# Patient Record
Sex: Female | Born: 1950 | Race: White | Hispanic: No | Marital: Married | Smoking: Never smoker
Health system: Southern US, Community
[De-identification: ages and names within clinical notes are randomized; demographics above are authoritative.]

## PROBLEM LIST (undated history)

## (undated) DIAGNOSIS — R519 Headache, unspecified: Secondary | ICD-10-CM

## (undated) DIAGNOSIS — Z8489 Family history of other specified conditions: Secondary | ICD-10-CM

## (undated) DIAGNOSIS — R011 Cardiac murmur, unspecified: Secondary | ICD-10-CM

## (undated) DIAGNOSIS — E039 Hypothyroidism, unspecified: Secondary | ICD-10-CM

## (undated) DIAGNOSIS — R51 Headache: Secondary | ICD-10-CM

## (undated) DIAGNOSIS — M1990 Unspecified osteoarthritis, unspecified site: Secondary | ICD-10-CM

## (undated) HISTORY — PX: TONSILLECTOMY: SUR1361

## (undated) HISTORY — DX: Hypothyroidism, unspecified: E03.9

## (undated) HISTORY — DX: Unspecified osteoarthritis, unspecified site: M19.90

---

## 1971-12-25 HISTORY — PX: WISDOM TOOTH EXTRACTION: SHX21

## 2003-10-03 ENCOUNTER — Encounter: Payer: Self-pay | Admitting: Emergency Medicine

## 2003-10-04 ENCOUNTER — Encounter (HOSPITAL_BASED_OUTPATIENT_CLINIC_OR_DEPARTMENT_OTHER): Payer: Self-pay | Admitting: Internal Medicine

## 2003-10-04 ENCOUNTER — Inpatient Hospital Stay (HOSPITAL_COMMUNITY): Admission: EM | Admit: 2003-10-04 | Discharge: 2003-10-05 | Payer: Self-pay | Admitting: Emergency Medicine

## 2007-06-24 ENCOUNTER — Ambulatory Visit: Payer: Self-pay | Admitting: Internal Medicine

## 2007-07-10 ENCOUNTER — Ambulatory Visit: Payer: Self-pay | Admitting: Internal Medicine

## 2010-08-15 IMAGING — CT CT NECK W/O CM
2 series · 10 of 14 positions shown, 12 images · IV contrast (CONTRAST)
Comparison: NONE

CLINICAL DATA: Abnormal thyroid ultrasound with appearance 
suggesting  adenopathy. 

CT NECK WITHOUT AND WITH INTRAVENOUS CONTRAST
TECHNIQUE: A series of axial 5-mm-thick slices at 5-mm 
increments were obtained prior to intravenous contrast.  Following 
the intravenous administration of contrast, 3-mm-thick slices at 
3-mm increments were obtained.

[Series 2: without · axial · non-contrast · 0.49mm/px · z∈[+21,+131]mm · 3 of 45 slices shown]
[im 12/45  bone]
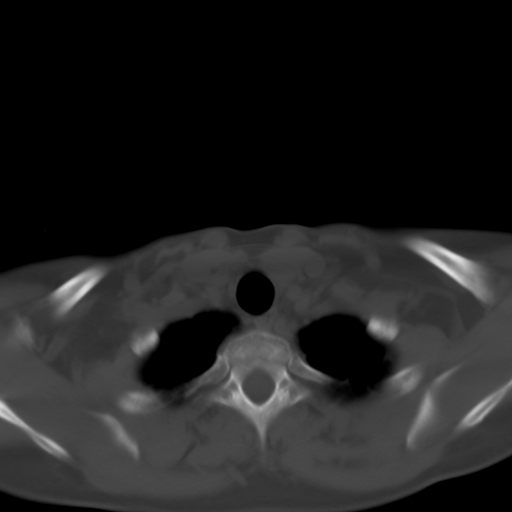
[im 23/45  bone]
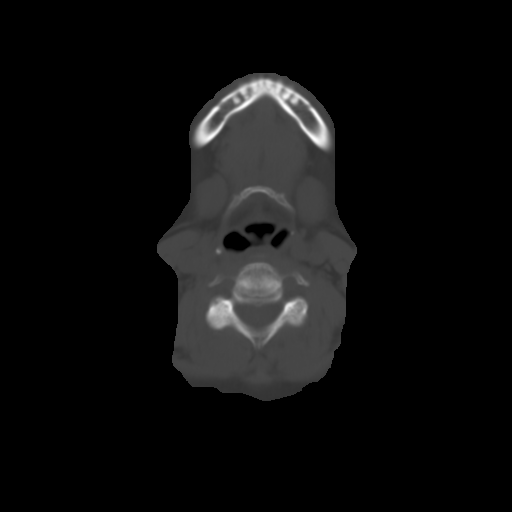
[im 34/45  bone]
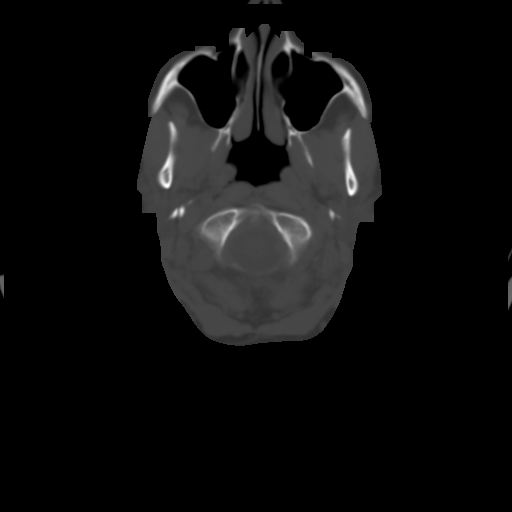

[Series 3: with contrast · axial · 0.49mm/px · z∈[-8,+157]mm · 7 of 75 slices shown, 9 images]
[im 10/75  soft-tissue]
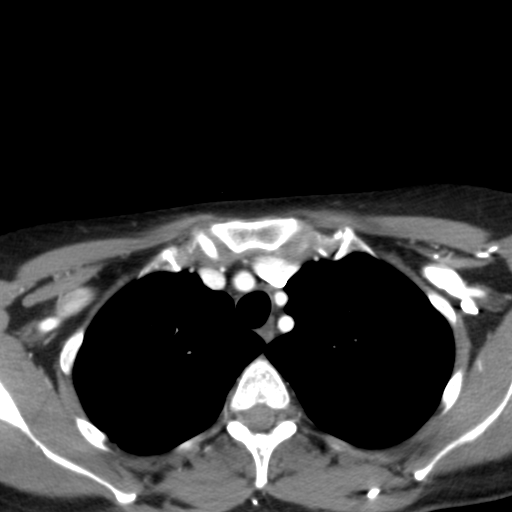
[im 10/75  bone]
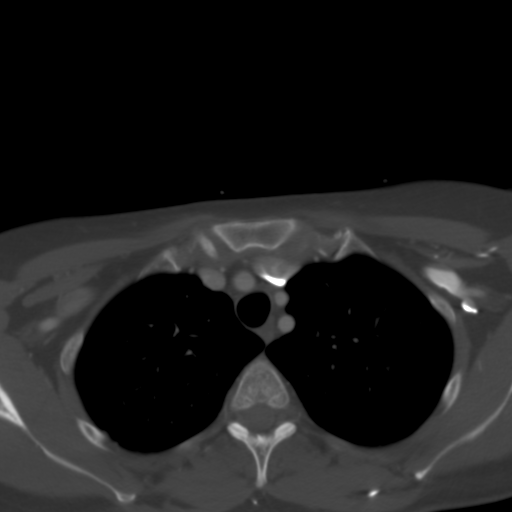
[im 19/75  bone]
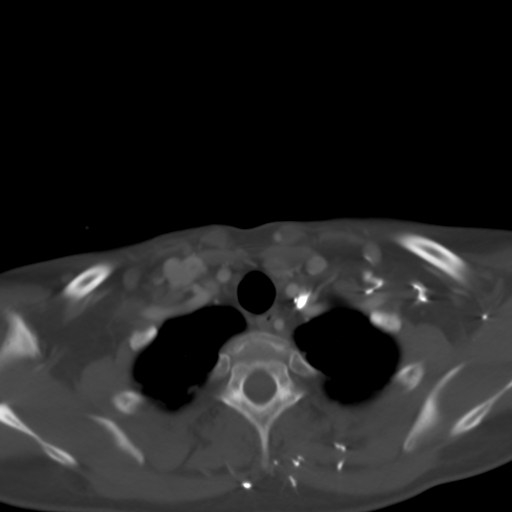
[im 28/75  bone]
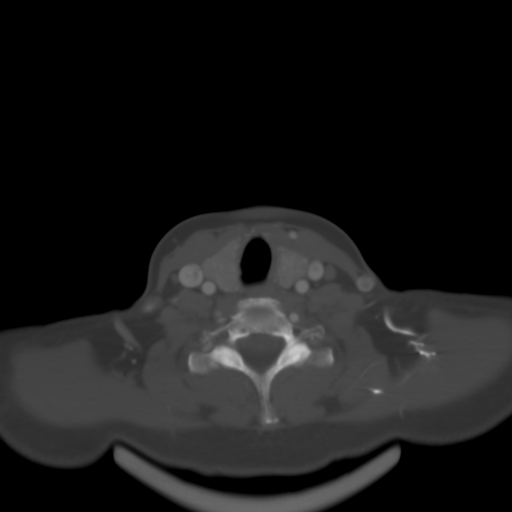
[im 38/75  bone]
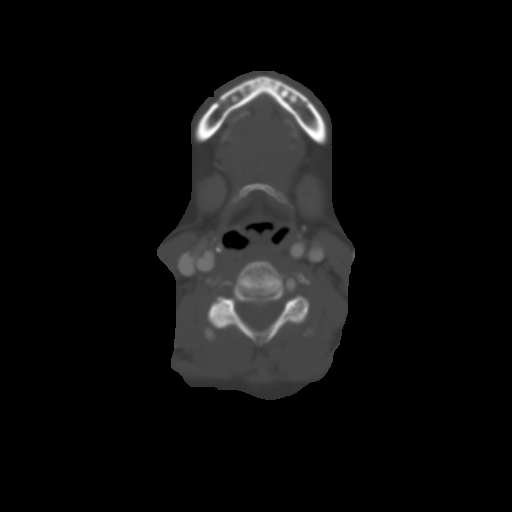
[im 47/75  soft-tissue]
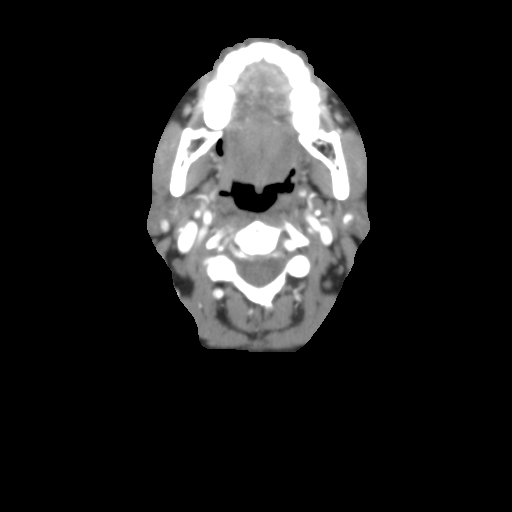
[im 47/75  bone]
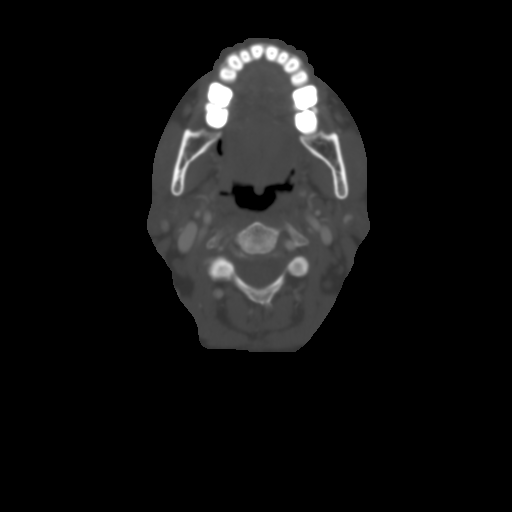
[im 56/75  bone]
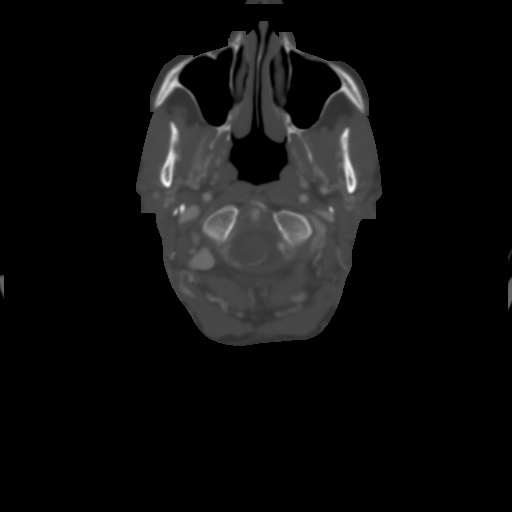
[im 65/75  bone]
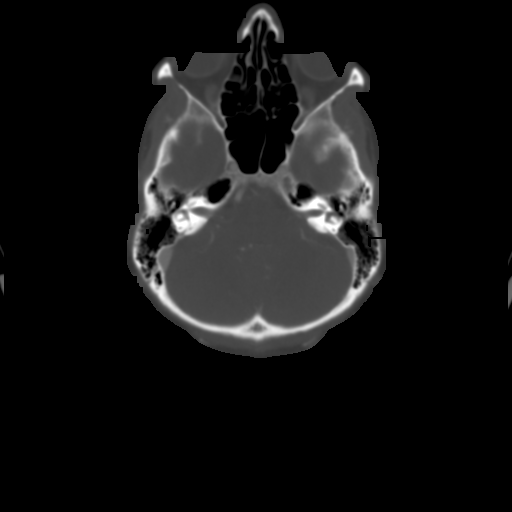

[10 of 14 positions shown; findings below may reference images not displayed]

FINDINGS: No evidence of nasopharyngeal, oropharyngeal, or 
hypopharyngeal mass.  No supraglottic, glottic, or infraglottic 
masses are identified.  Minimal reactive adenopathy in the neck 
without evidence of coalescent mass or adenopathy.  No evidence of 
parotid or submandibular gland mass or calculus.  No 
supraclavicular fossa mass or adenopathy.  Thyroid gland appears 
heterogeneous without focal mass on CT. Vascular structures appear 
unremarkable. No anterior superior mediastinal or apical lung 
masses.  No lytic or blastic lesions are identified.
IMPRESSION: Minimal reactive adenopathy.  No evidence of mass or 
adenopathy to suggest neoplasm. Mus, Tc Fethi 
electronically reviewed on 09/03/2008 Dict Date: 09/03/2008  Tran 
Date:  09/03/2008 DAS  JLM

## 2011-01-14 ENCOUNTER — Encounter: Payer: Self-pay | Admitting: Internal Medicine

## 2011-01-14 ENCOUNTER — Encounter: Payer: Self-pay | Admitting: Family Medicine

## 2011-05-11 NOTE — Discharge Summary (Signed)
   NAME:  Tonya Dawson, Tonya Dawson                            ACCOUNT NO.:  [id number]   MEDICAL RECORD NO.:  [id number]                   PATIENT TYPE:  INP   LOCATION:  0347                                 FACILITY:  [facility]   PHYSICIAN:  Sean A. Everardo All, M.D. [facility]           DATE OF BIRTH:  Feb 16, 1951   DATE OF ADMISSION:  10/03/2003  DATE OF DISCHARGE:  10/05/2003                                 DISCHARGE SUMMARY   REASON FOR ADMISSION:  Head injury with amnestic episode.   HISTORY OF PRESENT ILLNESS:  The patient is a 60 year old woman admitted by  Casimiro Needle E. Norins, M.D., on October 03, 2003, following a fall from a horse  on October 03, 2003, following which she reports amnesia for part of a day  after the episode and more than one day prior.  Please refer to Dr. Debby Bud'  admission history and physical for details.   HOSPITAL COURSE:  The patient was admitted and head CT did not show any  acute injury.  The patient's symptoms did not change during hospitalization,  and neurology was consulted.  Michael L. Thad Ranger, M.D., recommended in  patient EEG with outpatient follow-up.   DISCHARGE DIAGNOSIS:  Fall from a horse, with amnestic episode.   MEDICATIONS:  Tylenol if needed for pain.   No restriction on diet or activity.   FOLLOW-UP:  1. Dr. Thad Ranger in one week to review EEG results.  2. To see Dr. Debby Bud or Dr. Everardo All at your convenience to get established     with a primary care physician.                                               Sean A. Everardo All, M.D. [facility]    SAE/MEDQ  D:  10/05/2003  T:  10/05/2003  Job:  [id number]   cc:   Rosalyn Gess. Norins, M.D. [facility]

## 2011-05-11 NOTE — H&P (Signed)
NAME:  Tonya Dawson, Tonya Dawson                            ACCOUNT NO.:  [id number]   MEDICAL RECORD NO.:  [id number]                   PATIENT TYPE:  EMS   LOCATION:  ED                                   FACILITY:  [facility]   PHYSICIAN:  Rosalyn Gess. Norins, M.D. [facility]         DATE OF BIRTH:  May 10, 1951   DATE OF ADMISSION:  10/03/2003  DATE OF DISCHARGE:                                HISTORY & PHYSICAL   CHIEF COMPLAINT:  Mental status changes.   HISTORY OF PRESENT ILLNESS:  [name] is a healthy 60 year old married white  female, mother of 2 daughters, who went horse back riding today near  [place]. She fell off her horse but does not remember the details.  There is a question of loss of consciousness with the patient herself  reporting that when she awoke. Her loss of consciousness duration was  unknown. The patient reports that when she did awake, she started to feel  groggy. She had an episode of urinary incontinence. She was able to use her  cell phone to notify her husband on 2 occasions. He actually went and met  her on the trail by [place]. She had been in the company of  [employer], who found her and her horse. She was able to walk  back to the stable with her husband. She remained groggy throughout the  afternoon. Because of a history of migraine headache, they thought that she  might have been having migraine-like symptoms including nausea and non-  projectile vomiting. Because of persistent memory loss, she was brought to  [facility] emergency department for evaluation. She is  now admitted for observation.   PAST SURGICAL HISTORY:  Tonsillectomy.   PAST SURGICAL HISTORY:  The patient had a fracture of her right arm at age  60. She had the usual childhood diseases. She has had a normal GYN history  with a gravida 2, para 2. She has had migraines numerous since age 60 but  has not been seen by a neurologist in many years and does not  take any  medication for her headaches. Her frequency is 3 to 5 headaches a month.   MEDICATIONS:  Multivitamins. She was recently diagnosed with some type of  thyroid problem and is on medication, although she does not recall the name.   HABITS:  Tobacco none. Alcohol 4 to 6 ounces per week. She has no  recreational drug use.   ALLERGIES:  No known drug allergies.   FAMILY HISTORY:  Negative for colon cancer. Negative for breast cancer.  Negative for CAD. Negative for diabetes. Positive for migraines in her  father.   SOCIAL HISTORY:  The patient is married 60 years. She has 2 daughters, age  60 and 76. 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 She reports her marriage is in good health.  In addition to  horse back riding, she likes to ski and she otherwise, is active.   REVIEW OF SYSTEMS:  Negative for any constitutional symptoms. Specifically  no weight loss. She has had no cardiovascular, pulmonary, or  gastrointestinal complaints. No prior existing neurologic complaints except  for migraine.   PHYSICAL EXAMINATION:  VITAL SIGNS:  Temperature 99.7, blood pressure  134/84, pulse 82, respiratory rate 20.  GENERAL:  This is a well developed, well nourished, Caucasian female in no  acute distress.  HEENT:  Normocephalic, atraumatic with no signs of hematoma anywhere on her  skull. She had no Battle sign. No raccoon's eyes. No abrasions to her face.  Nose is straight with a midline septum. Oropharynx without lesions.  Conjunctiva and sclera is clear.  NECK:  Supple. There is no thyromegaly.  NODES:  No adenopathy is noted in the cervical or supraclavicular regions.  CHEST:  No CVA tenderness.  LUNGS:  Clear to auscultation and percussion.  CARDIOVASCULAR:  2+ radial and dorsalis pedis pulses. She had a quiet  precordium with a regular rate and rhythm without murmur, rub, or gallop.  BREAST:  Examination deferred to Dr. Elana Alm.  ABDOMEN:   Soft with positive bowel sounds. No guarding or rebound. No  organomegaly or splenomegaly is noted.  PELVIC/RECTAL:  Examinations referred to Dr. Elana Alm.  EXTREMITIES:  Without deformity.  NEUROLOGIC: The patient is awake. Her speech is clear and conversant. She  follows commands. She was able to perform serial 7's. She was able to do  simple computation. She was able to repeat numbers in a forward fashion but  could not repeat numbers in reverse order, nor could she spell the work  world backwards. She does know her birthday. She had trouble remembering her  children's age. She cannot recall the days events. She cannot tell me the  days date. However, she did get the season correctly. The patient does  repeatedly ask about today's events and is not retaining information.  Cranial nerves 2-12 reveals normal facial symmetry and muscle movement.  Extraocular muscles intact. Pupils were equal. They were poorly reactive to  light and do not really seem to have consensual constriction. Funduscopic  examination revealed normal disk margins. There is normal vascular markings  with no abnormality noted. The patient was able to stick out her tongue  without difficulty or deviation. Shoulder shrug was normal. Motor strength,  the patient has good strength throughout and it is equal. DTR's were 2+ and  symmetrical. Toes were downgoing. Sensation was well preserved to light  touch. Cerebellar function, the patient was not stood. She was able to sit  without assistance. She had no tremor. She had no cogwheeling. She had no  pronator drift.   LABORATORY DATA:  CT scan of the brain was read out as negative.   ASSESSMENT:  1. Neurologic. The patient either lost consciousness and fell off her horse     or fell off her horse and lost consciousness. The former is given     credence by the fact that she has no head trauma on examination. She was    able to function in the sense of being able to use her  cell phone and     call home, despite not being able to remember the days events. The     patient has a history of migraine headache but this does not fit her     migraine pattern. Question of whether she had a central nervous  system     event. Of note, the patient had been given 2 mg of morphine before my     examination, which could account for some mild clouding. The patient will     be admitted for observation with q. 4 hour neurologic checks. If her     mental status is not cleared in the a.m., I would recommend MRI of the     brain and possibly neurology consultation. Will avoid all narcotic     sedatives and other mind altering drugs.  2. Endocrine. The patient recently diagnosed with thyroid disorder and     started on medication. Most likely, she is     hypothyroid. Will get a TSH and free T4.  3. Musculoskeletal. The patient has several abrasions and she probably has     some contusions. Will use Toradol for pain relief.                                               Rosalyn Gess Norins, M.D. [facility]    MEN/MEDQ  D:  10/04/2003  T:  10/04/2003  Job:  [id number]   cc:   S. Kyra Manges, M.D.  [phone] [address]  [place]  [place] [address]  Fax: [phone]

## 2011-05-11 NOTE — Consult Note (Signed)
NAME:  Tonya, Dawson                            ACCOUNT NO.:  [id number]   MEDICAL RECORD NO.:  [id number]                   PATIENT TYPE:  INP   LOCATION:  0347                                 FACILITY:  [facility]   PHYSICIAN:  Marolyn Hammock. Thad Ranger, M.D.           DATE OF BIRTH:  Dec 18, 1951   DATE OF CONSULTATION:  10/04/2003  DATE OF DISCHARGE:                                   CONSULTATION   REFERRED BY:  Sean A. Everardo All, M.D.   REASON FOR ADMISSION:  Amnestic episode.   HISTORY OF PRESENT ILLNESS:  This is the initial inpatient consultation  evaluation of this 59 year old woman with a past medical history including  migraine. The patient reports that she was out riding yesterday morning and  apparently fell off of her horse. She really does not remember any details  about this or anything at all about yesterday except for going out in the  morning and preparing for her ride. She apparently was found by some  students who found her. She was subsequently groggy and confused throughout  the remainder of the afternoon and had some nausea and vomiting. She also  had persistent difficulty remembering what had happened and seemed confused  about what was going on around her. Subsequently she was brought to the  emergency room for these symptoms. A CT of the head was performed and was  negative and she was admitted for observation. Over the course of the day,  she has gradually improved. She now says that she feels about normal  although she may be having a little bit of trouble concentrating on things.  She has a little bit of a headache but does not experience any nausea at  this time and she feels almost completely back to baseline. She denies every  having any episodes like this before. There is no previous history of  stroke, seizure, or head injury although she does report occasionally having  migraines so severe that she will be a little bit confused.   PAST MEDICAL HISTORY:   Remarkable for migraine as above. These were fairly a  big problem when she was younger, was not much of a problem for many years  and has gotten a little bit worse recently around the menopause. She denies  any other chronic medical problems.   FAMILY HISTORY/SOCIAL HISTORY/REVIEW OF SYSTEMS:  As outlined in the H&P by  Dr. Debby Bud on October 03, 2003.   MEDICATIONS:  Presently the patient is receiving p.r.n. Tylenol, Toradol,  Phenergan and ibuprofen otherwise no prescription medications.   PHYSICAL EXAMINATION:  VITAL SIGNS:  Temperature 99.8, blood pressure  112/63, pulse 76, respirations 16.  GENERAL:  This is a healthy appearing female in no evident distress.  HEENT:  Head cranium was normocephalic, atraumatic. Oropharynx was benign.  NECK:  Supple without carotid bruits.  HEART:  Regular rate and rhythm without murmurs.  EXTREMITIES:  She does have some evident scrapes on her left elbow and knee.  Range of motion in the extremities is full.  NEUROLOGIC:  Mental status, she is awake, alert and oriented to time, place  and person. Recent memory is intact in that she recalls 3/3 memory objects  after distraction. She has a little trouble naming the past president. She  is able to perform concentration tasks spelling world backwards but has a  little trouble with serial 7's.  Speech is fluent and not dysarthric and she  is able to repeat a phrase. Mood is euthymic and affect appropriate. Cranial  nerves, funduscopic exam is benign. Pupils equal and briskly reactive.  Extraocular movements are normal without nystagmus. Visual fields are full  to confrontation. Hearing is intact to symmetric finger rub. Facial  sensation intact to pinprick. Face, tongue and palate move normally and  symmetrically. Shoulder shrug strength was normal. Lower extremity normal  bulk and tone. Normal strength in all tested extremity muscles. Sensation  intact to light touch, pinprick and vibration in all  extremities.  Coordination, rapid alternating movements are normal. Finger to nose and  heel to shin performed well. Gait, she arises from a chair easily and her  stance is normal and is able to heel toe and tandem walk without difficulty.  Reflexes 2+ and symmetric, toes are down going.   LABORATORY DATA:  CMET remarkable only for an elevated glucose of 138, CBC  remarkable for en elevated white cell count of 15.9 with 91% neutrophils  otherwise unremarkable. Thyroid exam was unremarkable. CT is reportedly  unremarkable. I personally reviewed the MRI of the brain performed earlier  today to my eye it is entirely normal.   IMPRESSION:  Retrograde and antegrade amnesia following trauma related to a  mild closed head injury with a sheer injury, this has improved and she seems  about back to baseline. I cannot exclude a seizure or syncopal event as the  index event but these do not seem likely on the basis of examination as  everything she relates is pretty difficult for a postconcussive injury.   RECOMMENDATIONS:  Will  check EEG in the morning. Once the EEG is complete,  she may be discharged and we can get back with her through any abnormalities  which are not expected. I do not think she needs any further workup or any  further prophylaxis. I doubt that this would be any concern for her in the  future.   Thank you for the consultation.                                               Michael L. Thad Ranger, M.D.    MLR/MEDQ  D:  10/04/2003  T:  10/05/2003  Job:  [id number]   cc:   S. Kyra Manges, M.D.  [phone] [address]  [place]  [place] [address]  Fax: [phone]   Gregary Signs A. Everardo All, M.D. [facility]

## 2011-10-29 ENCOUNTER — Other Ambulatory Visit: Payer: Self-pay | Admitting: Internal Medicine

## 2011-10-29 DIAGNOSIS — E042 Nontoxic multinodular goiter: Secondary | ICD-10-CM

## 2011-11-01 ENCOUNTER — Ambulatory Visit
Admission: RE | Admit: 2011-11-01 | Discharge: 2011-11-01 | Disposition: A | Discharge status: Home / Self Care | Payer: BC Managed Care – PPO | Source: Ambulatory Visit | Attending: Internal Medicine | Admitting: Internal Medicine

## 2011-11-01 DIAGNOSIS — E042 Nontoxic multinodular goiter: Secondary | ICD-10-CM

## 2012-04-08 ENCOUNTER — Encounter: Payer: Self-pay | Admitting: Internal Medicine

## 2012-09-11 IMAGING — US US SOFT TISSUE HEAD/NECK
1 series · 14 of 25 positions shown · non-contrast
Comparison: None.

CLINICAL DATA: Hashimoto's thyroiditis

THYROID ULTRASOUND
TECHNIQUE: Ultrasound examination of the thyroid gland and adjacent
soft tissues was performed.

[Series 1: us soft tissue head/neck · 0.06mm/px · 14 of 44 slices shown]
[im 1/44]
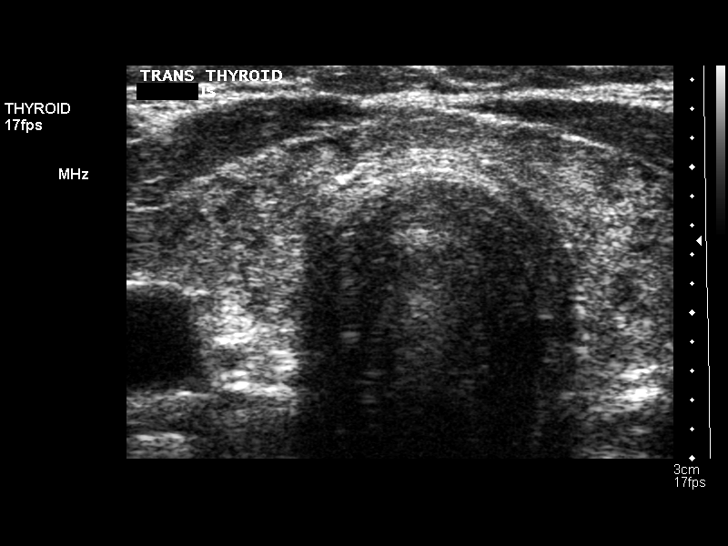
[im 4/44]
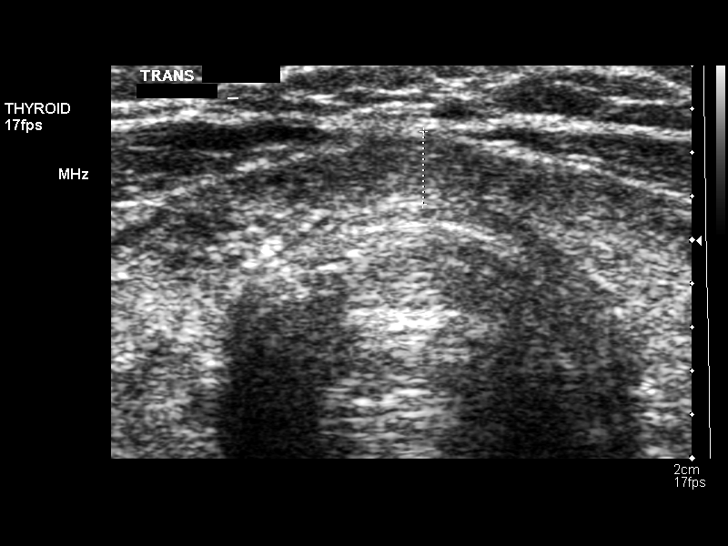
[im 8/44]
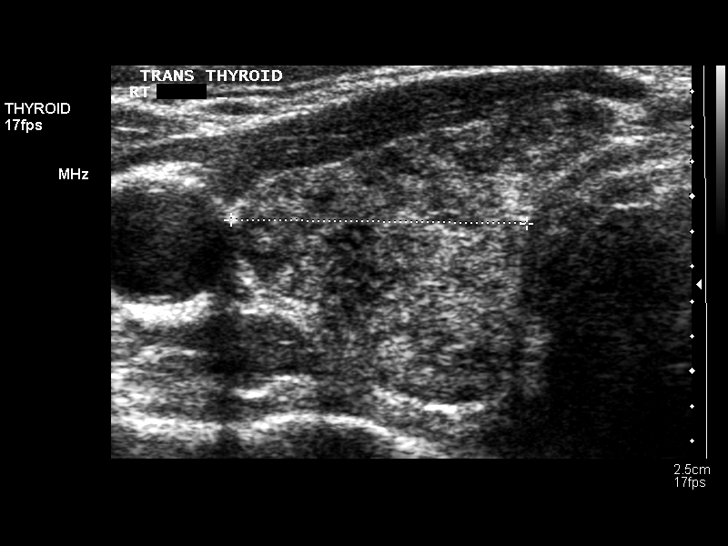
[im 11/44]
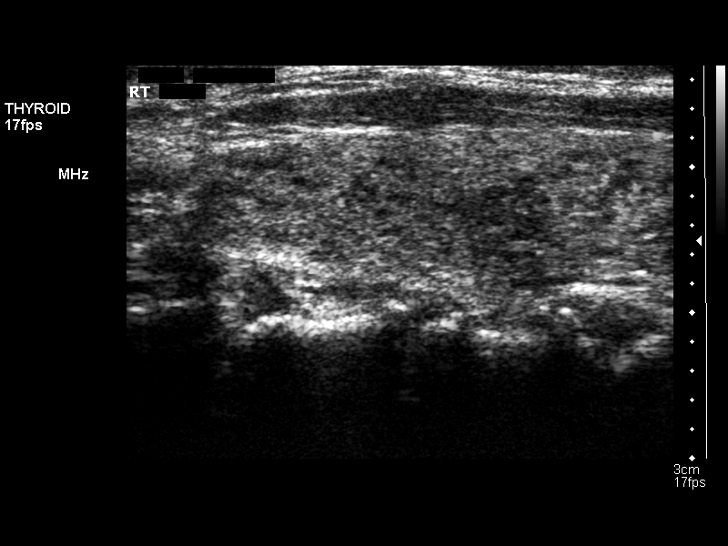
[im 15/44]
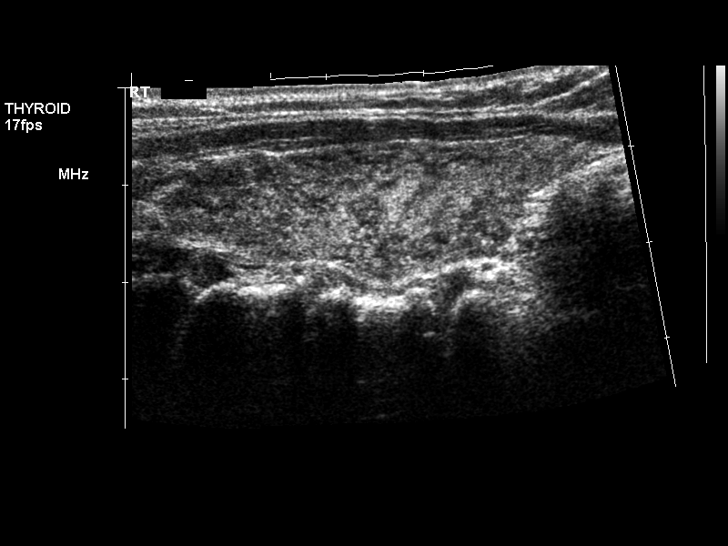
[im 17/44]
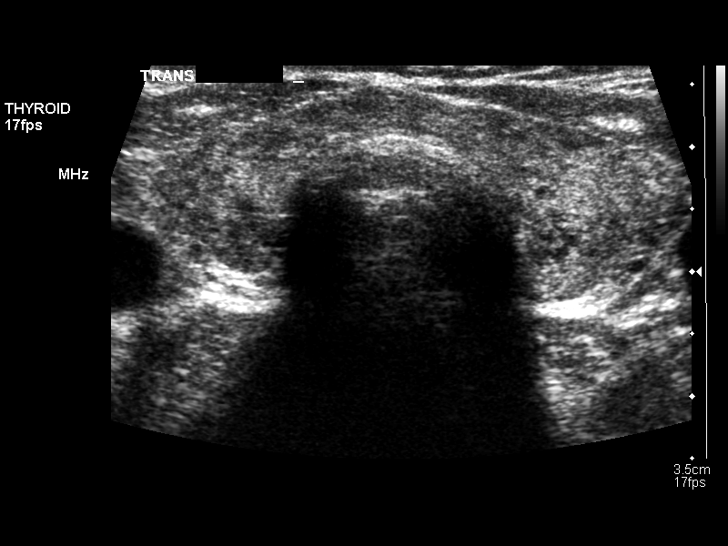
[im 20/44]
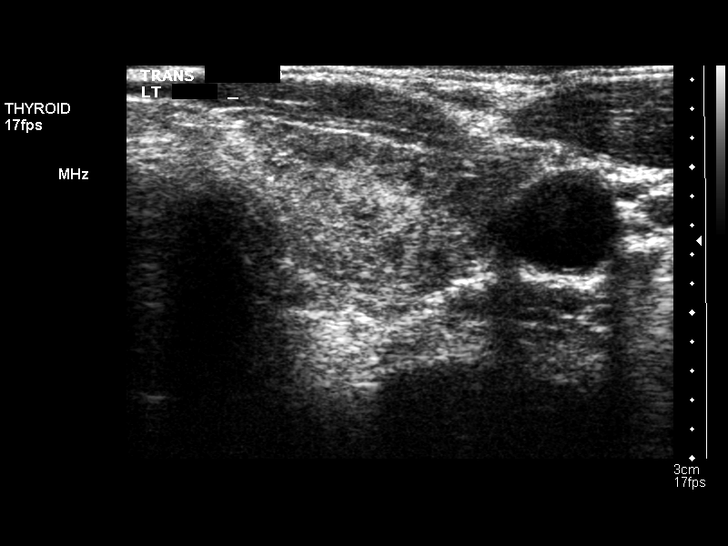
[im 24/44]
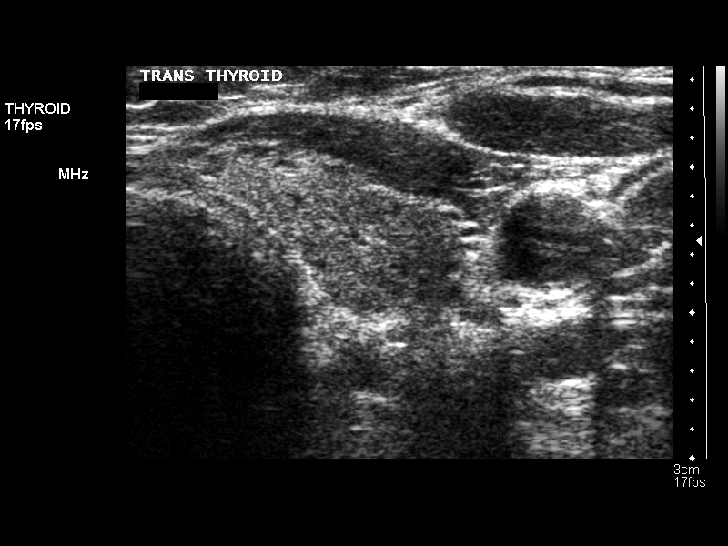
[im 27/44]
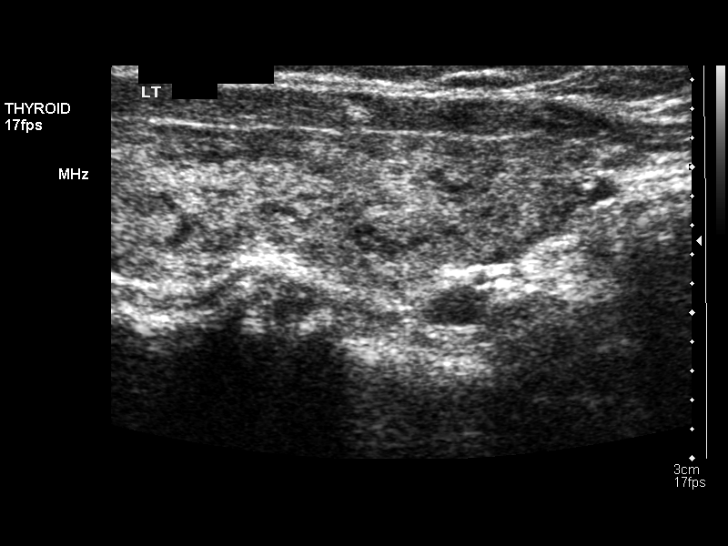
[im 29/44]
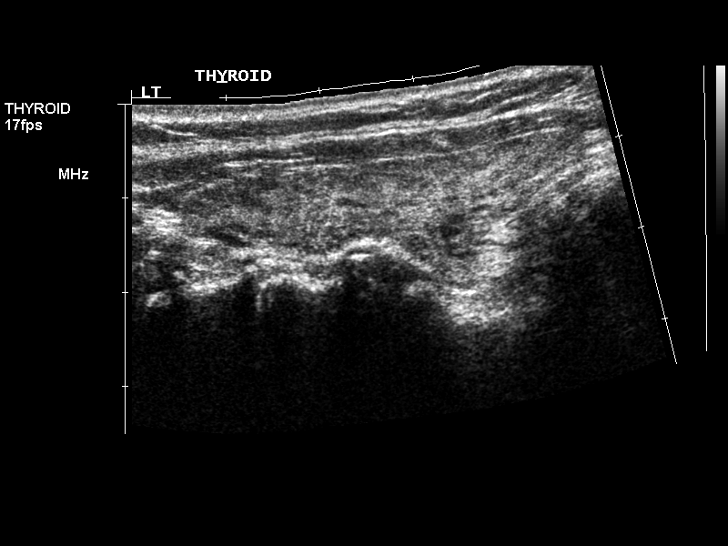
[im 33/44]
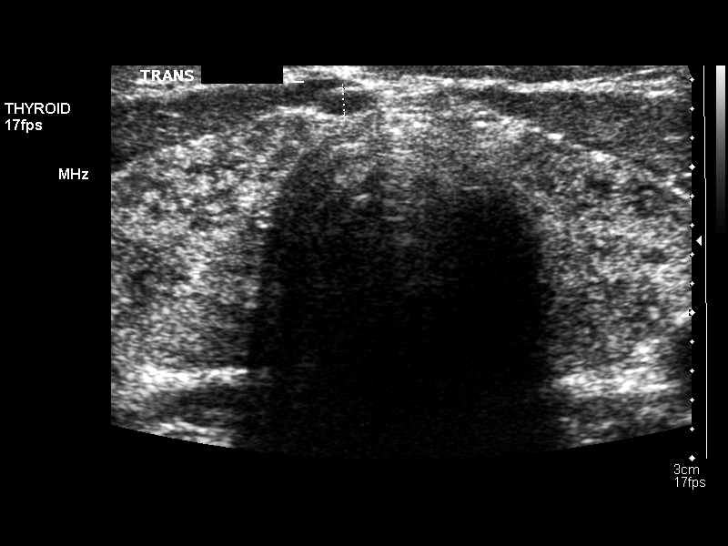
[im 36/44]
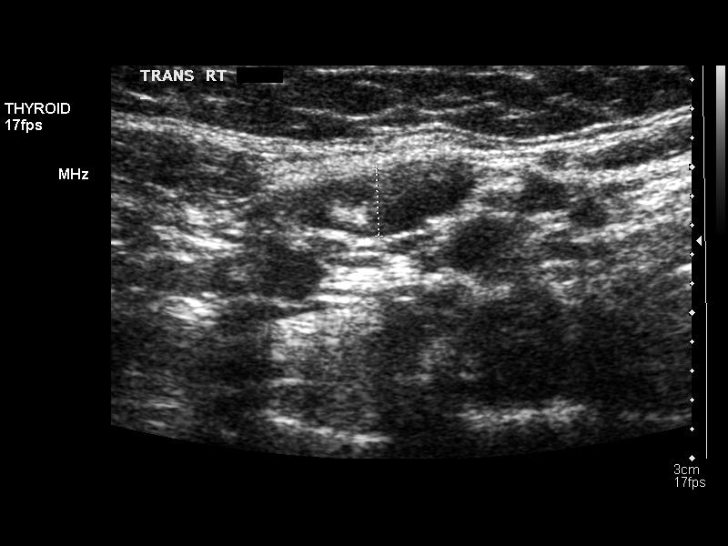
[im 40/44]
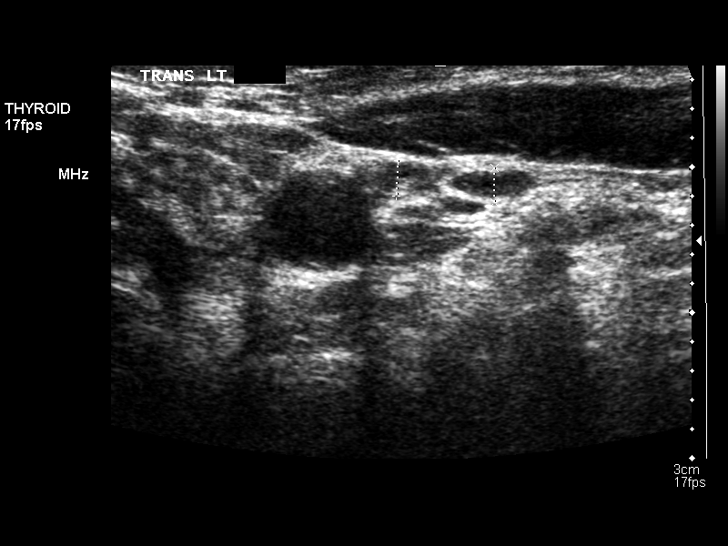
[im 44/44]
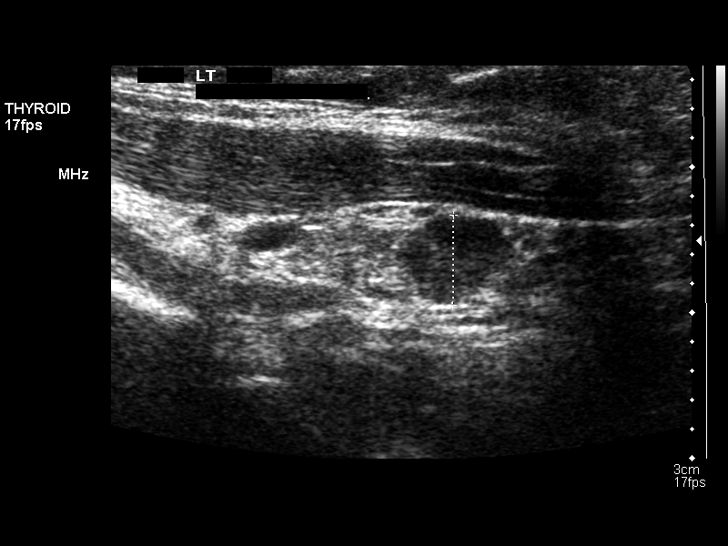

[14 of 25 positions shown; findings below may reference images not displayed]

FINDINGS: Right thyroid lobe:  Measures 4.7 x 1.5 x 1.7 cm.
Left thyroid lobe:  Measures 4.6 x 1.1 x 1.9 cm.
Isthmus:  Measures 3 mm in thickness.

Focal nodules:  Diffusely heterogeneous thyroid echotexture without
discrete focal nodules.  Thyroid is hypervascular.

Lymphadenopathy:  No suspicious lymphadenopathy is seen.
IMPRESSION: Diffusely heterogeneous/hyperemic thyroid gland, compatible with
Hashimoto's thyroiditis.

## 2012-10-22 ENCOUNTER — Other Ambulatory Visit (HOSPITAL_COMMUNITY)
Admission: RE | Admit: 2012-10-22 | Discharge: 2012-10-22 | Disposition: A | Discharge status: Home / Self Care | Payer: BC Managed Care – PPO | Source: Ambulatory Visit | Attending: Internal Medicine | Admitting: Internal Medicine

## 2012-10-22 DIAGNOSIS — Z01419 Encounter for gynecological examination (general) (routine) without abnormal findings: Secondary | ICD-10-CM | POA: Insufficient documentation

## 2013-02-03 ENCOUNTER — Encounter: Payer: Self-pay | Admitting: Internal Medicine

## 2013-03-25 ENCOUNTER — Encounter: Payer: Self-pay | Admitting: Internal Medicine

## 2013-03-25 ENCOUNTER — Ambulatory Visit (AMBULATORY_SURGERY_CENTER): Payer: BC Managed Care – PPO | Admitting: *Deleted

## 2013-03-25 VITALS — Ht 65.0 in | Wt 146.4 lb

## 2013-03-25 DIAGNOSIS — Z1211 Encounter for screening for malignant neoplasm of colon: Secondary | ICD-10-CM

## 2013-03-25 MED ORDER — MOVIPREP 100 G PO SOLR
ORAL | Status: DC
Start: 2013-03-25 — End: 2013-04-03

## 2013-04-03 ENCOUNTER — Ambulatory Visit (AMBULATORY_SURGERY_CENTER): Payer: BC Managed Care – PPO | Admitting: Internal Medicine

## 2013-04-03 ENCOUNTER — Encounter: Payer: Self-pay | Admitting: Internal Medicine

## 2013-04-03 VITALS — BP 121/78 | HR 61 | Temp 97.0°F | Resp 10 | Ht 65.0 in | Wt 146.0 lb

## 2013-04-03 DIAGNOSIS — Z1211 Encounter for screening for malignant neoplasm of colon: Secondary | ICD-10-CM

## 2013-04-03 DIAGNOSIS — Z800 Family history of malignant neoplasm of digestive organs: Secondary | ICD-10-CM

## 2013-04-03 HISTORY — PX: COLONOSCOPY: SHX174

## 2013-04-03 MED ORDER — SODIUM CHLORIDE 0.9 % IV SOLN: 500.0000 mL | INTRAVENOUS | Status: DC

## 2013-04-03 NOTE — Progress Notes (Signed)
Patient did not experience any of the following events: a burn prior to discharge; a fall within the facility; wrong site/side/patient/procedure/implant event; or a hospital transfer or hospital admission upon discharge from the facility. (G8907) Patient did not have preoperative order for IV antibiotic SSI prophylaxis. (G8918)  

## 2013-04-03 NOTE — Patient Instructions (Addendum)
Discharge instructions given with verbal understanding. Normal exam. Resume previous medications. YOU HAD AN ENDOSCOPIC PROCEDURE TODAY AT THE [facility]: Refer to the procedure report that was given to you for any specific questions about what was found during the examination.  If the procedure report does not answer your questions, please call your gastroenterologist to clarify.  If you requested that your care partner not be given the details of your procedure findings, then the procedure report has been included in a sealed envelope for you to review at your convenience later.  YOU SHOULD EXPECT: Some feelings of bloating in the abdomen. Passage of more gas than usual.  Walking can help get rid of the air that was put into your GI tract during the procedure and reduce the bloating. If you had a lower endoscopy (such as a colonoscopy or flexible sigmoidoscopy) you may notice spotting of blood in your stool or on the toilet paper. If you underwent a bowel prep for your procedure, then you may not have a normal bowel movement for a few days.  DIET: Your first meal following the procedure should be a light meal and then it is ok to progress to your normal diet.  A half-sandwich or bowl of soup is an example of a good first meal.  Heavy or fried foods are harder to digest and may make you feel nauseous or bloated.  Likewise meals heavy in dairy and vegetables can cause extra gas to form and this can also increase the bloating.  Drink plenty of fluids but you should avoid alcoholic beverages for 24 hours.  ACTIVITY: Your care partner should take you home directly after the procedure.  You should plan to take it easy, moving slowly for the rest of the day.  You can resume normal activity the day after the procedure however you should NOT DRIVE or use heavy machinery for 24 hours (because of the sedation medicines used during the test).    SYMPTOMS TO REPORT IMMEDIATELY: A gastroenterologist  can be reached at any hour.  During normal business hours, 8:30 AM to 5:00 PM Monday through Friday, call [phone].  After hours and on weekends, please call the GI answering service at [phone] who will take a message and have the physician on call contact you.   Following lower endoscopy (colonoscopy or flexible sigmoidoscopy):  Excessive amounts of blood in the stool  Significant tenderness or worsening of abdominal pains  Swelling of the abdomen that is new, acute  Fever of 100F or higher  FOLLOW UP: If any biopsies were taken you will be contacted by phone or by letter within the next 1-3 weeks.  Call your gastroenterologist if you have not heard about the biopsies in 3 weeks.  Our staff will call the home number listed on your records the next business day following your procedure to check on you and address any questions or concerns that you may have at that time regarding the information given to you following your procedure. This is a courtesy call and so if there is no answer at the home number and we have not heard from you through the emergency physician on call, we will assume that you have returned to your regular daily activities without incident.  SIGNATURES/CONFIDENTIALITY: You and/or your care partner have signed paperwork which will be entered into your electronic medical record.  These signatures attest to the fact that that the information above on your After Visit Summary has been reviewed   and is understood.  Full responsibility of the confidentiality of this discharge information lies with you and/or your care-partner. 

## 2013-04-03 NOTE — Op Note (Signed)
[facility] [address] [place], [address]   COLONOSCOPY PROCEDURE REPORT  PATIENT: Tonya Dawson, Tonya Dawson  MR#: [id number] BIRTHDATE: 1951-12-02 , 62  yrs. old GENDER: Female ENDOSCOPIST: Hart Carwin, MD REFERRED BY:  Romero Liner, M.D. PROCEDURE DATE:  04/03/2013 PROCEDURE:   Colonoscopy, screening ASA CLASS:   Class II INDICATIONS:Patient's immediate family history of colon cancer and 06/2007 colon was normal,.  family hx of colon polyps MEDICATIONS: MAC sedation, administered by CRNA and Propofol (Diprivan) 170 mg IV  DESCRIPTION OF PROCEDURE:   After the risks and benefits and of the procedure were explained, informed consent was obtained.  A digital rectal exam revealed no abnormalities of the rectum.    The LB [id number]-[id number]  endoscope was introduced through the anus and advanced to the cecum, which was identified by both the appendix and ileocecal valve .  The quality of the prep was excellent, using MoviPrep .  The instrument was then slowly withdrawn as the colon was fully examined.     COLON FINDINGS: A normal appearing cecum, ileocecal valve, and appendiceal orifice were identified.  The ascending, hepatic flexure, transverse, splenic flexure, descending, sigmoid colon and rectum appeared unremarkable.  No polyps or cancers were seen. Retroflexed views revealed no abnormalities.     The scope was then withdrawn from the patient and the procedure completed.  COMPLICATIONS: There were no complications. ENDOSCOPIC IMPRESSION: Normal colon  RECOMMENDATIONS: High fiber diet   REPEAT EXAM: In 5 year(s)  for Colonoscopy.  cc:  _______________________________ eSignedHart Carwin, MD 04/03/2013 10:16 AM

## 2013-04-06 ENCOUNTER — Telehealth: Payer: Self-pay | Admitting: *Deleted

## 2013-04-06 NOTE — Telephone Encounter (Signed)
  Follow up Call-  Call back number 04/03/2013  Post procedure Call Back phone  # [phone]  Permission to leave phone message Yes     Patient questions:  Do you have a fever, pain , or abdominal swelling? no Pain Score  0 *  Have you tolerated food without any problems? yes  Have you been able to return to your normal activities? yes  Do you have any questions about your discharge instructions: Diet   no Medications  no Follow up visit  no  Do you have questions or concerns about your Care? no  Actions: * If pain score is 4 or above: No action needed, pain <4.

## 2013-04-08 ENCOUNTER — Encounter: Payer: BC Managed Care – PPO | Admitting: Internal Medicine

## 2013-10-28 ENCOUNTER — Other Ambulatory Visit (HOSPITAL_COMMUNITY)
Admission: RE | Admit: 2013-10-28 | Discharge: 2013-10-28 | Disposition: A | Discharge status: Home / Self Care | Payer: BC Managed Care – PPO | Source: Ambulatory Visit | Attending: Internal Medicine | Admitting: Internal Medicine

## 2013-10-28 DIAGNOSIS — Z01419 Encounter for gynecological examination (general) (routine) without abnormal findings: Secondary | ICD-10-CM | POA: Insufficient documentation

## 2015-06-21 ENCOUNTER — Encounter (HOSPITAL_COMMUNITY): Payer: Self-pay

## 2015-06-21 ENCOUNTER — Encounter (HOSPITAL_COMMUNITY)
Admission: RE | Admit: 2015-06-21 | Discharge: 2015-06-21 | Disposition: A | Discharge status: Home / Self Care | Payer: BLUE CROSS/BLUE SHIELD | Source: Ambulatory Visit | Attending: Orthopedic Surgery | Admitting: Orthopedic Surgery

## 2015-06-21 DIAGNOSIS — Z01812 Encounter for preprocedural laboratory examination: Secondary | ICD-10-CM | POA: Diagnosis not present

## 2015-06-21 DIAGNOSIS — M1611 Unilateral primary osteoarthritis, right hip: Secondary | ICD-10-CM | POA: Diagnosis not present

## 2015-06-21 HISTORY — DX: Cardiac murmur, unspecified: R01.1

## 2015-06-21 HISTORY — DX: Headache, unspecified: R51.9

## 2015-06-21 HISTORY — DX: Headache: R51

## 2015-06-21 HISTORY — DX: Family history of other specified conditions: Z84.89

## 2015-06-21 LAB — BASIC METABOLIC PANEL
Anion gap: 8 (ref 5–15)
BUN: 20 mg/dL (ref 6–20)
CALCIUM: 9.2 mg/dL (ref 8.9–10.3)
CO2: 28 mmol/L (ref 22–32)
Chloride: 103 mmol/L (ref 101–111)
Creatinine, Ser: 0.81 mg/dL (ref 0.44–1.00)
Glucose, Bld: 98 mg/dL (ref 65–99)
Potassium: 4.9 mmol/L (ref 3.5–5.1)
SODIUM: 139 mmol/L (ref 135–145)

## 2015-06-21 LAB — CBC
HCT: 43.1 % (ref 36.0–46.0)
Hemoglobin: 14.5 g/dL (ref 12.0–15.0)
MCH: 31.8 pg (ref 26.0–34.0)
MCHC: 33.6 g/dL (ref 30.0–36.0)
MCV: 94.5 fL (ref 78.0–100.0)
PLATELETS: 268 10*3/uL (ref 150–400)
RBC: 4.56 MIL/uL (ref 3.87–5.11)
RDW: 12 % (ref 11.5–15.5)
WBC: 7.8 10*3/uL (ref 4.0–10.5)

## 2015-06-21 LAB — PROTIME-INR
INR: 0.95 (ref 0.00–1.49)
PROTHROMBIN TIME: 12.9 s (ref 11.6–15.2)

## 2015-06-21 LAB — SURGICAL PCR SCREEN
MRSA, PCR: NEGATIVE
Staphylococcus aureus: NEGATIVE

## 2015-06-21 LAB — APTT: aPTT: 29 seconds (ref 24–37)

## 2015-06-21 NOTE — Progress Notes (Signed)
EKG per chart 05/07/2015  H&P per chart per Dr Shelia Media 06/07/2015 with clearance

## 2015-06-21 NOTE — H&P (Signed)
TOTAL HIP ADMISSION H&P  Patient is admitted for right total hip arthroplasty, anterior approach.  Subjective:  Chief Complaint:     Right hip primary OA / pain  HPI: Tonya Dawson, 64 y.o. female, has a history of pain and functional disability in the right hip(s) due to arthritis and patient has failed non-surgical conservative treatments for greater than 12 weeks to include NSAID's and/or analgesics, corticosteriod injections and activity modification.  Onset of symptoms was gradual starting 5 years ago with gradually worsening course since that time.The patient noted no past surgery on the right hip(s).  Patient currently rates pain in the right hip at 10 out of 10 with activity. Patient has worsening of pain with activity and weight bearing, trendelenberg gait, pain that interfers with activities of daily living and pain with passive range of motion. Patient has evidence of periarticular osteophytes and joint space narrowing by imaging studies. This condition presents safety issues increasing the risk of falls.  There is no current active infection.  Risks, benefits and expectations were discussed with the patient.  Risks including but not limited to the risk of anesthesia, blood clots, nerve damage, blood vessel damage, failure of the prosthesis, infection and up to and including death.  Patient understand the risks, benefits and expectations and wishes to proceed with surgery.   PCP: Horatio Pel, MD  D/C Plans:      Home with HHPT  Post-op Meds:       No Rx given  Tranexamic Acid:      To be given - IV   Decadron:      Is to be given  FYI:     ASA post-op  Norco post-op    Past Medical History  Diagnosis Date  . Hypothyroidism   . Arthritis   . Family history of adverse reaction to anesthesia     pt states mother developed dementia from anesthesia at age 25  . Heart murmur   . Headache     hx of migraines last one 12 years ago     Past Surgical History  Procedure  Laterality Date  . Wisdom tooth extraction  1973  . Tonsillectomy      No prescriptions prior to admission   No Known Allergies   History  Substance Use Topics  . Smoking status: Never Smoker   . Smokeless tobacco: Never Used  . Alcohol Use: 3.6 oz/week    6 Glasses of wine per week     Comment: socially     Family History  Problem Relation Age of Onset  . Colon cancer Mother 73     Review of Systems  Constitutional: Negative.   Eyes: Negative.   Respiratory: Negative.   Cardiovascular: Negative.   Gastrointestinal: Negative.   Genitourinary: Negative.   Musculoskeletal: Positive for joint pain.  Skin: Negative.   Neurological: Positive for headaches.  Endo/Heme/Allergies: Negative.   Psychiatric/Behavioral: Negative.     Objective:  Physical Exam  Constitutional: She is oriented to person, place, and time. She appears well-developed and well-nourished.  HENT:  Head: Normocephalic and atraumatic.  Eyes: Pupils are equal, round, and reactive to light.  Neck: Neck supple. No JVD present. No tracheal deviation present. No thyromegaly present.  Cardiovascular: Normal rate, regular rhythm, normal heart sounds and intact distal pulses.   Respiratory: Effort normal and breath sounds normal. No stridor. No respiratory distress. She has no wheezes.  GI: Soft. There is no tenderness. There is no guarding.  Musculoskeletal:  Right hip: She exhibits decreased range of motion, decreased strength, tenderness and bony tenderness. She exhibits no swelling, no deformity and no laceration.  Lymphadenopathy:    She has no cervical adenopathy.  Neurological: She is alert and oriented to person, place, and time.  Skin: Skin is warm and dry.  Psychiatric: She has a normal mood and affect.    Vital signs in last 24 hours: Temp:  [98 F (36.7 C)] 98 F (36.7 C) (06/28 1443) Pulse Rate:  [58] 58 (06/28 1443) Resp:  [16] 16 (06/28 1443) BP: (141)/(78) 141/78 mmHg (06/28  1443) SpO2:  [100 %] 100 % (06/28 1443) Weight:  [65.828 kg (145 lb 2 oz)] 65.828 kg (145 lb 2 oz) (06/28 1443)  Labs:   Estimated body mass index is 24.30 kg/(m^2) as calculated from the following:   Height as of 04/03/13: 5\' 5"  (1.651 m).   Weight as of 04/03/13: 66.225 kg (146 lb).   Imaging Review Plain radiographs demonstrate severe degenerative joint disease of the right hip(s). The bone quality appears to be good for age and reported activity level.  Assessment/Plan:  End stage arthritis, right hip(s)  The patient history, physical examination, clinical judgement of the provider and imaging studies are consistent with end stage degenerative joint disease of the right hip(s) and total hip arthroplasty is deemed medically necessary. The treatment options including medical management, injection therapy, arthroscopy and arthroplasty were discussed at length. The risks and benefits of total hip arthroplasty were presented and reviewed. The risks due to aseptic loosening, infection, stiffness, dislocation/subluxation,  thromboembolic complications and other imponderables were discussed.  The patient acknowledged the explanation, agreed to proceed with the plan and consent was signed. Patient is being admitted for inpatient treatment for surgery, pain control, PT, OT, prophylactic antibiotics, VTE prophylaxis, progressive ambulation and ADL's and discharge planning.The patient is planning to be discharged home with home health services.      Tonya Pugh Jerrion Tabbert   PA-C  06/21/2015, 3:34 PM

## 2015-06-21 NOTE — Patient Instructions (Signed)
ASTHA PROBASCO  06/21/2015   Your procedure is scheduled on: Tuesday June 28, 2015   Report to [facility]  Entrance take [place]  elevators to 3rd floor to  [place] at 7:05 AM.  Call this number if you have problems the morning of surgery [phone]   Remember: ONLY 1 PERSON MAY GO WITH YOU TO SHORT STAY TO GET  READY MORNING OF [place].  Do not eat food or drink liquids :After Midnight.     Take these medicines the morning of surgery with A SIP OF WATER: Levothyroxine                                You may not have any metal on your body including hair pins and              piercings  Do not wear jewelry, make-up, lotions, powders or perfumes, deodorant             Do not wear nail polish.  Do not shave  48 hours prior to surgery.                Do not bring valuables to the hospital. [place].  Contacts, dentures or bridgework may not be worn into surgery.  Leave suitcase in the car. After surgery it may be brought to your room.     Patients discharged the day of surgery will not be allowed to drive home.  Name and phone number of your driver:  Special Instructions: DO NOT REMOVE BLUE BLOOD BAND PRIOR TO SURGERY DATE              Please read over the following fact sheets you were given:MRSA INFORMATION SHEET; INCENTIVE SPIROMETER; BLOOD TRANSFUSION FACT SHEET  _____________________________________________________________________             [facility] - Preparing for Surgery Before surgery, you can play an important role.  Because skin is not sterile, your skin needs to be as free of germs as possible.  You can reduce the number of germs on your skin by washing with CHG (chlorahexidine gluconate) soap before surgery.  CHG is an antiseptic cleaner which kills germs and bonds with the skin to continue killing germs even after washing. Please DO NOT use if you have an allergy to CHG or  antibacterial soaps.  If your skin becomes reddened/irritated stop using the CHG and inform your nurse when you arrive at Short Stay. Do not shave (including legs and underarms) for at least 48 hours prior to the first CHG shower.  You may shave your face/neck. Please follow these instructions carefully:  1.  Shower with CHG Soap the night before surgery and the  morning of Surgery.  2.  If you choose to wash your hair, wash your hair first as usual with your  normal  shampoo.  3.  After you shampoo, rinse your hair and body thoroughly to remove the  shampoo.                           4.  Use CHG as you would any other liquid soap.  You can apply chg directly  to the  skin and wash                       Gently with a scrungie or clean washcloth.  5.  Apply the CHG Soap to your body ONLY FROM THE NECK DOWN.   Do not use on face/ open                           Wound or open sores. Avoid contact with eyes, ears mouth and genitals (private parts).                       Wash face,  Genitals (private parts) with your normal soap.             6.  Wash thoroughly, paying special attention to the area where your surgery  will be performed.  7.  Thoroughly rinse your body with warm water from the neck down.  8.  DO NOT shower/wash with your normal soap after using and rinsing off  the CHG Soap.                9.  Pat yourself dry with a clean towel.            10.  Wear clean pajamas.            11.  Place clean sheets on your bed the night of your first shower and do not  sleep with pets. Day of Surgery : Do not apply any lotions/deodorants the morning of surgery.  Please wear clean clothes to the hospital/surgery center.  FAILURE TO FOLLOW THESE INSTRUCTIONS MAY RESULT IN THE CANCELLATION OF YOUR SURGERY PATIENT SIGNATURE_________________________________  NURSE SIGNATURE__________________________________  ________________________________________________________________________   Adam Phenix  An incentive spirometer is a tool that can help keep your lungs clear and active. This tool measures how well you are filling your lungs with each breath. Taking long deep breaths may help reverse or decrease the chance of developing breathing (pulmonary) problems (especially infection) following:  A long period of time when you are unable to move or be active. BEFORE THE PROCEDURE   If the spirometer includes an indicator to show your best effort, your nurse or respiratory therapist will set it to a desired goal.  If possible, sit up straight or lean slightly forward. Try not to slouch.  Hold the incentive spirometer in an upright position. INSTRUCTIONS FOR USE   Sit on the edge of your bed if possible, or sit up as far as you can in bed or on a chair.  Hold the incentive spirometer in an upright position.  Breathe out normally.  Place the mouthpiece in your mouth and seal your lips tightly around it.  Breathe in slowly and as deeply as possible, raising the piston or the ball toward the top of the column.  Hold your breath for 3-5 seconds or for as long as possible. Allow the piston or ball to fall to the bottom of the column.  Remove the mouthpiece from your mouth and breathe out normally.  Rest for a few seconds and repeat Steps 1 through 7 at least 10 times every 1-2 hours when you are awake. Take your time and take a few normal breaths between deep breaths.  The spirometer may include an indicator to show your best effort. Use the indicator as a goal to work toward during each repetition.  After each set of  10 deep breaths, practice coughing to be sure your lungs are clear. If you have an incision (the cut made at the time of surgery), support your incision when coughing by placing a pillow or rolled up towels firmly against it. Once you are able to get out of bed, walk around indoors and cough well. You may stop using the incentive spirometer when instructed by  your caregiver.  RISKS AND COMPLICATIONS  Take your time so you do not get dizzy or light-headed.  If you are in pain, you may need to take or ask for pain medication before doing incentive spirometry. It is harder to take a deep breath if you are having pain. AFTER USE  Rest and breathe slowly and easily.  It can be helpful to keep track of a log of your progress. Your caregiver can provide you with a simple table to help with this. If you are using the spirometer at home, follow these instructions: [place] IF:   You are having difficultly using the spirometer.  You have trouble using the spirometer as often as instructed.  Your pain medication is not giving enough relief while using the spirometer.  You develop fever of 100.5 F (38.1 C) or higher. SEEK IMMEDIATE MEDICAL CARE IF:   You cough up bloody sputum that had not been present before.  You develop fever of 102 F (38.9 C) or greater.  You develop worsening pain at or near the incision site. MAKE SURE YOU:   Understand these instructions.  Will watch your condition.  Will get help right away if you are not doing well or get worse. Document Released: 04/22/2007 Document Revised: 03/03/2012 Document Reviewed: 06/23/2007 Exit[employer] Patient Information 2014 Exit[employer], [place].   ________________________________________________________________________  WHAT IS A BLOOD TRANSFUSION? Blood Transfusion Information  A transfusion is the replacement of blood or some of its parts. Blood is made up of multiple cells which provide different functions.  Red blood cells carry oxygen and are used for blood loss replacement.  White blood cells fight against infection.  Platelets control bleeding.  Plasma helps clot blood.  Other blood products are available for specialized needs, such as hemophilia or other clotting disorders. BEFORE THE TRANSFUSION  Who gives blood for transfusions?   Healthy volunteers who are  fully evaluated to make sure their blood is safe. This is blood bank blood. Transfusion therapy is the safest it has ever been in the practice of medicine. Before blood is taken from a donor, a complete history is taken to make sure that person has no history of diseases nor engages in risky social behavior (examples are intravenous drug use or sexual activity with multiple partners). The donor's travel history is screened to minimize risk of transmitting infections, such as malaria. The donated blood is tested for signs of infectious diseases, such as HIV and hepatitis. The blood is then tested to be sure it is compatible with you in order to minimize the chance of a transfusion reaction. If you or a relative donates blood, this is often done in anticipation of surgery and is not appropriate for emergency situations. It takes many days to process the donated blood. RISKS AND COMPLICATIONS Although transfusion therapy is very safe and saves many lives, the main dangers of transfusion include:   Getting an infectious disease.  Developing a transfusion reaction. This is an allergic reaction to something in the blood you were given. Every precaution is taken to prevent this. The decision to have a blood  transfusion has been considered carefully by your caregiver before blood is given. Blood is not given unless the benefits outweigh the risks. AFTER THE TRANSFUSION  Right after receiving a blood transfusion, you will usually feel much better and more energetic. This is especially true if your red blood cells have gotten low (anemic). The transfusion raises the level of the red blood cells which carry oxygen, and this usually causes an energy increase.  The nurse administering the transfusion will monitor you carefully for complications. HOME CARE INSTRUCTIONS  No special instructions are needed after a transfusion. You may find your energy is better. Speak with your caregiver about any limitations on  activity for underlying diseases you may have. SEEK MEDICAL CARE IF:   Your condition is not improving after your transfusion.  You develop redness or irritation at the intravenous (IV) site. SEEK IMMEDIATE MEDICAL CARE IF:  Any of the following symptoms occur over the next 12 hours:  Shaking chills.  You have a temperature by mouth above 102 F (38.9 C), not controlled by medicine.  Chest, back, or muscle pain.  People around you feel you are not acting correctly or are confused.  Shortness of breath or difficulty breathing.  Dizziness and fainting.  You get a rash or develop hives.  You have a decrease in urine output.  Your urine turns a dark color or changes to pink, red, or brown. Any of the following symptoms occur over the next 10 days:  You have a temperature by mouth above 102 F (38.9 C), not controlled by medicine.  Shortness of breath.  Weakness after normal activity.  The white part of the eye turns yellow (jaundice).  You have a decrease in the amount of urine or are urinating less often.  Your urine turns a dark color or changes to pink, red, or brown. Document Released: 12/07/2000 Document Revised: 03/03/2012 Document Reviewed: 07/26/2008 [facility] Patient Information 2014 [place], [place].  _______________________________________________________________________

## 2015-06-28 ENCOUNTER — Inpatient Hospital Stay (HOSPITAL_COMMUNITY): Payer: BLUE CROSS/BLUE SHIELD | Admitting: Anesthesiology

## 2015-06-28 ENCOUNTER — Encounter (HOSPITAL_COMMUNITY): Payer: Self-pay | Admitting: *Deleted

## 2015-06-28 ENCOUNTER — Inpatient Hospital Stay (HOSPITAL_COMMUNITY): Payer: BLUE CROSS/BLUE SHIELD

## 2015-06-28 ENCOUNTER — Inpatient Hospital Stay (HOSPITAL_COMMUNITY)
Admission: RE | Admit: 2015-06-28 | Discharge: 2015-06-29 | DRG: 470 | Disposition: A | Discharge status: Home Health | Payer: BLUE CROSS/BLUE SHIELD | Source: Ambulatory Visit | Attending: Orthopedic Surgery | Admitting: Orthopedic Surgery

## 2015-06-28 ENCOUNTER — Encounter (HOSPITAL_COMMUNITY)
Admission: RE | Disposition: A | Discharge status: Home Health | Payer: Self-pay | Source: Ambulatory Visit | Attending: Orthopedic Surgery

## 2015-06-28 DIAGNOSIS — M25551 Pain in right hip: Secondary | ICD-10-CM | POA: Diagnosis present

## 2015-06-28 DIAGNOSIS — E039 Hypothyroidism, unspecified: Secondary | ICD-10-CM | POA: Diagnosis present

## 2015-06-28 DIAGNOSIS — Z96649 Presence of unspecified artificial hip joint: Secondary | ICD-10-CM

## 2015-06-28 DIAGNOSIS — Z800 Family history of malignant neoplasm of digestive organs: Secondary | ICD-10-CM

## 2015-06-28 DIAGNOSIS — M1611 Unilateral primary osteoarthritis, right hip: Secondary | ICD-10-CM | POA: Diagnosis present

## 2015-06-28 DIAGNOSIS — Z01812 Encounter for preprocedural laboratory examination: Secondary | ICD-10-CM | POA: Diagnosis not present

## 2015-06-28 HISTORY — PX: TOTAL HIP ARTHROPLASTY: SHX124

## 2015-06-28 LAB — TYPE AND SCREEN
ABO/RH(D): O POS
Antibody Screen: NEGATIVE

## 2015-06-28 LAB — ABO/RH: ABO/RH(D): O POS

## 2015-06-28 SURGERY — ARTHROPLASTY, HIP, TOTAL, ANTERIOR APPROACH
Anesthesia: Spinal | Site: Hip | Laterality: Right

## 2015-06-28 MED ORDER — LIDOCAINE HCL (CARDIAC) 20 MG/ML IV SOLN
INTRAVENOUS | Status: AC
  Filled 2015-06-28: qty 5

## 2015-06-28 MED ORDER — HYDROMORPHONE HCL 1 MG/ML IJ SOLN
0.2500 mg | INTRAMUSCULAR | Status: DC | PRN
  Administered 2015-06-28 (×4): 0.5 mg via INTRAVENOUS

## 2015-06-28 MED ORDER — SODIUM CHLORIDE 0.9 % IJ SOLN
INTRAMUSCULAR | Status: AC
  Filled 2015-06-28: qty 10

## 2015-06-28 MED ORDER — BISACODYL 10 MG RE SUPP: 10.0000 mg | Freq: Every day | RECTAL | Status: DC | PRN

## 2015-06-28 MED ORDER — HYDROCODONE-ACETAMINOPHEN 7.5-325 MG PO TABS
1.0000 | ORAL_TABLET | ORAL | Status: DC
  Administered 2015-06-28: 2 via ORAL
  Administered 2015-06-28: 1 via ORAL
  Administered 2015-06-28: 2 via ORAL
  Administered 2015-06-29: 1 via ORAL
  Administered 2015-06-29 (×2): 2 via ORAL
  Filled 2015-06-28: qty 1
  Filled 2015-06-28 (×5): qty 2

## 2015-06-28 MED ORDER — LACTATED RINGERS IV SOLN: INTRAVENOUS | Status: DC

## 2015-06-28 MED ORDER — LACTATED RINGERS IV SOLN
INTRAVENOUS | Status: DC
  Administered 2015-06-28: 12:00:00 via INTRAVENOUS

## 2015-06-28 MED ORDER — EPHEDRINE SULFATE 50 MG/ML IJ SOLN
INTRAMUSCULAR | Status: DC | PRN
  Administered 2015-06-28: 10 mg via INTRAVENOUS

## 2015-06-28 MED ORDER — FERROUS SULFATE 325 (65 FE) MG PO TABS
325.0000 mg | ORAL_TABLET | Freq: Three times a day (TID) | ORAL | Status: DC
  Administered 2015-06-28 – 2015-06-29 (×2): 325 mg via ORAL
  Filled 2015-06-28 (×5): qty 1

## 2015-06-28 MED ORDER — DEXAMETHASONE SODIUM PHOSPHATE 10 MG/ML IJ SOLN
10.0000 mg | Freq: Once | INTRAMUSCULAR | Status: AC
  Administered 2015-06-28: 10 mg via INTRAVENOUS

## 2015-06-28 MED ORDER — CEFAZOLIN SODIUM-DEXTROSE 2-3 GM-% IV SOLR
INTRAVENOUS | Status: AC
  Filled 2015-06-28: qty 50

## 2015-06-28 MED ORDER — HYDROMORPHONE HCL 2 MG/ML IJ SOLN
INTRAMUSCULAR | Status: AC
  Filled 2015-06-28: qty 1

## 2015-06-28 MED ORDER — HYDROMORPHONE HCL 1 MG/ML IJ SOLN
INTRAMUSCULAR | Status: AC
  Filled 2015-06-28: qty 1

## 2015-06-28 MED ORDER — PROPOFOL INFUSION 10 MG/ML OPTIME
INTRAVENOUS | Status: DC | PRN
  Administered 2015-06-28: 140 ug/kg/min via INTRAVENOUS

## 2015-06-28 MED ORDER — EPHEDRINE SULFATE 50 MG/ML IJ SOLN
INTRAMUSCULAR | Status: AC
  Filled 2015-06-28: qty 1

## 2015-06-28 MED ORDER — BUPIVACAINE IN DEXTROSE 0.75-8.25 % IT SOLN
INTRATHECAL | Status: DC | PRN
  Administered 2015-06-28: 2 mL via INTRATHECAL

## 2015-06-28 MED ORDER — MENTHOL 3 MG MT LOZG: 1.0000 | LOZENGE | OROMUCOSAL | Status: DC | PRN

## 2015-06-28 MED ORDER — ASPIRIN EC 325 MG PO TBEC
325.0000 mg | DELAYED_RELEASE_TABLET | Freq: Two times a day (BID) | ORAL | Status: DC
  Administered 2015-06-29: 325 mg via ORAL
  Filled 2015-06-28 (×3): qty 1

## 2015-06-28 MED ORDER — CHLORHEXIDINE GLUCONATE 4 % EX LIQD: 60.0000 mL | Freq: Once | CUTANEOUS | Status: DC

## 2015-06-28 MED ORDER — PROPOFOL 10 MG/ML IV BOLUS
INTRAVENOUS | Status: DC | PRN
  Administered 2015-06-28 (×2): 20 mg via INTRAVENOUS

## 2015-06-28 MED ORDER — POLYETHYLENE GLYCOL 3350 17 G PO PACK
17.0000 g | PACK | Freq: Two times a day (BID) | ORAL | Status: DC
  Administered 2015-06-29: 17 g via ORAL

## 2015-06-28 MED ORDER — PROPOFOL 10 MG/ML IV BOLUS
INTRAVENOUS | Status: AC
  Filled 2015-06-28: qty 20

## 2015-06-28 MED ORDER — LEVOTHYROXINE SODIUM 100 MCG PO TABS
100.0000 ug | ORAL_TABLET | Freq: Every day | ORAL | Status: DC
  Filled 2015-06-28 (×2): qty 1

## 2015-06-28 MED ORDER — ONDANSETRON HCL 4 MG PO TABS
4.0000 mg | ORAL_TABLET | Freq: Four times a day (QID) | ORAL | Status: DC | PRN
  Administered 2015-06-29: 4 mg via ORAL
  Filled 2015-06-28: qty 1

## 2015-06-28 MED ORDER — ONDANSETRON HCL 4 MG/2ML IJ SOLN
INTRAMUSCULAR | Status: AC
Start: 2015-06-28 — End: 2015-06-28
  Filled 2015-06-28: qty 2

## 2015-06-28 MED ORDER — METHOCARBAMOL 500 MG PO TABS
500.0000 mg | ORAL_TABLET | Freq: Four times a day (QID) | ORAL | Status: DC | PRN
  Administered 2015-06-28: 500 mg via ORAL
  Filled 2015-06-28: qty 1

## 2015-06-28 MED ORDER — METOCLOPRAMIDE HCL 10 MG PO TABS: 5.0000 mg | ORAL_TABLET | Freq: Three times a day (TID) | ORAL | Status: DC | PRN

## 2015-06-28 MED ORDER — TRANEXAMIC ACID 1000 MG/10ML IV SOLN
1000.0000 mg | Freq: Once | INTRAVENOUS | Status: AC
  Administered 2015-06-28: 1000 mg via INTRAVENOUS
  Filled 2015-06-28: qty 10

## 2015-06-28 MED ORDER — 0.9 % SODIUM CHLORIDE (POUR BTL) OPTIME
TOPICAL | Status: DC | PRN
  Administered 2015-06-28: 1000 mL

## 2015-06-28 MED ORDER — DOCUSATE SODIUM 100 MG PO CAPS
100.0000 mg | ORAL_CAPSULE | Freq: Two times a day (BID) | ORAL | Status: DC
Start: 2015-06-28 — End: 2015-06-29
  Administered 2015-06-28 – 2015-06-29 (×2): 100 mg via ORAL

## 2015-06-28 MED ORDER — DEXTROSE 5 % IV SOLN
500.0000 mg | Freq: Four times a day (QID) | INTRAVENOUS | Status: DC | PRN
  Administered 2015-06-28: 500 mg via INTRAVENOUS
  Filled 2015-06-28 (×2): qty 5

## 2015-06-28 MED ORDER — FENTANYL CITRATE (PF) 100 MCG/2ML IJ SOLN
INTRAMUSCULAR | Status: AC
  Filled 2015-06-28: qty 2

## 2015-06-28 MED ORDER — CELECOXIB 200 MG PO CAPS
200.0000 mg | ORAL_CAPSULE | Freq: Two times a day (BID) | ORAL | Status: DC
  Administered 2015-06-28 – 2015-06-29 (×3): 200 mg via ORAL
  Filled 2015-06-28 (×5): qty 1

## 2015-06-28 MED ORDER — HYDROMORPHONE HCL 1 MG/ML IJ SOLN
0.5000 mg | INTRAMUSCULAR | Status: DC | PRN
  Filled 2015-06-28: qty 1

## 2015-06-28 MED ORDER — DEXAMETHASONE SODIUM PHOSPHATE 10 MG/ML IJ SOLN
10.0000 mg | Freq: Once | INTRAMUSCULAR | Status: AC
Start: 2015-06-29 — End: 2015-06-29
  Administered 2015-06-29: 10 mg via INTRAVENOUS

## 2015-06-28 MED ORDER — FENTANYL CITRATE (PF) 100 MCG/2ML IJ SOLN
INTRAMUSCULAR | Status: DC | PRN
  Administered 2015-06-28 (×2): 50 ug via INTRAVENOUS

## 2015-06-28 MED ORDER — SODIUM CHLORIDE 0.9 % IV SOLN
100.0000 mL/h | INTRAVENOUS | Status: DC
  Administered 2015-06-28 (×2): 100 mL/h via INTRAVENOUS
  Filled 2015-06-28 (×4): qty 1000

## 2015-06-28 MED ORDER — PHENOL 1.4 % MT LIQD
1.0000 | OROMUCOSAL | Status: DC | PRN
  Filled 2015-06-28: qty 177

## 2015-06-28 MED ORDER — METOCLOPRAMIDE HCL 5 MG/ML IJ SOLN
5.0000 mg | Freq: Three times a day (TID) | INTRAMUSCULAR | Status: DC | PRN
  Administered 2015-06-28: 10 mg via INTRAVENOUS
  Filled 2015-06-28: qty 2

## 2015-06-28 MED ORDER — LACTATED RINGERS IV SOLN
INTRAVENOUS | Status: DC | PRN
  Administered 2015-06-28 (×2): via INTRAVENOUS

## 2015-06-28 MED ORDER — ONDANSETRON HCL 4 MG/2ML IJ SOLN: 4.0000 mg | Freq: Four times a day (QID) | INTRAMUSCULAR | Status: DC | PRN

## 2015-06-28 MED ORDER — CEFAZOLIN SODIUM-DEXTROSE 2-3 GM-% IV SOLR
2.0000 g | INTRAVENOUS | Status: AC
  Administered 2015-06-28: 2 g via INTRAVENOUS

## 2015-06-28 MED ORDER — DEXAMETHASONE SODIUM PHOSPHATE 10 MG/ML IJ SOLN
INTRAMUSCULAR | Status: AC
  Filled 2015-06-28: qty 1

## 2015-06-28 MED ORDER — MAGNESIUM CITRATE PO SOLN: 1.0000 | Freq: Once | ORAL | Status: AC | PRN

## 2015-06-28 MED ORDER — CEFAZOLIN SODIUM-DEXTROSE 2-3 GM-% IV SOLR
2.0000 g | Freq: Four times a day (QID) | INTRAVENOUS | Status: AC
  Administered 2015-06-28 (×2): 2 g via INTRAVENOUS
  Filled 2015-06-28 (×2): qty 50

## 2015-06-28 MED ORDER — MIDAZOLAM HCL 5 MG/5ML IJ SOLN
INTRAMUSCULAR | Status: DC | PRN
Start: 2015-06-28 — End: 2015-06-28
  Administered 2015-06-28: 2 mg via INTRAVENOUS

## 2015-06-28 MED ORDER — ALUM & MAG HYDROXIDE-SIMETH 200-200-20 MG/5ML PO SUSP
30.0000 mL | ORAL | Status: DC | PRN
  Administered 2015-06-29: 30 mL via ORAL
  Filled 2015-06-28: qty 30

## 2015-06-28 MED ORDER — DIPHENHYDRAMINE HCL 25 MG PO CAPS: 25.0000 mg | ORAL_CAPSULE | Freq: Four times a day (QID) | ORAL | Status: DC | PRN

## 2015-06-28 MED ORDER — HYDROMORPHONE HCL 1 MG/ML IJ SOLN
INTRAMUSCULAR | Status: DC | PRN
  Administered 2015-06-28 (×2): 1 mg via INTRAVENOUS

## 2015-06-28 MED ORDER — HYDROMORPHONE HCL 1 MG/ML IJ SOLN
INTRAMUSCULAR | Status: AC
  Administered 2015-06-28: 1 mg
  Filled 2015-06-28: qty 1

## 2015-06-28 SURGICAL SUPPLY — 40 items
BAG SPEC THK2 15X12 ZIP CLS (MISCELLANEOUS)
BAG ZIPLOCK 12X15 (MISCELLANEOUS) IMPLANT
CAPT HIP TOTAL 2 ×2 IMPLANT
COVER PERINEAL POST (MISCELLANEOUS) ×3 IMPLANT
DRAPE C-ARM 42X120 X-RAY (DRAPES) ×3 IMPLANT
DRAPE STERI IOBAN 125X83 (DRAPES) ×3 IMPLANT
DRAPE U-SHAPE 47X51 STRL (DRAPES) ×9 IMPLANT
DRSG AQUACEL AG ADV 3.5X10 (GAUZE/BANDAGES/DRESSINGS) ×3 IMPLANT
DURAPREP 26ML APPLICATOR (WOUND CARE) ×3 IMPLANT
ELECT BLADE TIP CTD 4 INCH (ELECTRODE) ×3 IMPLANT
ELECT PENCIL ROCKER SW 15FT (MISCELLANEOUS) ×2 IMPLANT
ELECT REM PT RETURN 15FT ADLT (MISCELLANEOUS) ×2 IMPLANT
ELECT REM PT RETURN 9FT ADLT (ELECTROSURGICAL) ×3
ELECTRODE REM PT RTRN 9FT ADLT (ELECTROSURGICAL) ×1 IMPLANT
FACESHIELD WRAPAROUND (MASK) ×12 IMPLANT
FACESHIELD WRAPAROUND OR TEAM (MASK) ×4 IMPLANT
GLOVE BIOGEL PI IND STRL 7.5 (GLOVE) ×1 IMPLANT
GLOVE BIOGEL PI IND STRL 8.5 (GLOVE) ×1 IMPLANT
GLOVE BIOGEL PI INDICATOR 7.5 (GLOVE) ×2
GLOVE BIOGEL PI INDICATOR 8.5 (GLOVE) ×2
GLOVE ECLIPSE 8.0 STRL XLNG CF (GLOVE) ×6 IMPLANT
GLOVE ORTHO TXT STRL SZ7.5 (GLOVE) ×3 IMPLANT
GOWN SPEC L3 XXLG W/TWL (GOWN DISPOSABLE) ×3 IMPLANT
GOWN STRL REUS W/TWL LRG LVL3 (GOWN DISPOSABLE) ×3 IMPLANT
HOLDER FOLEY CATH W/STRAP (MISCELLANEOUS) ×3 IMPLANT
KIT BASIN OR (CUSTOM PROCEDURE TRAY) ×3 IMPLANT
LIQUID BAND (GAUZE/BANDAGES/DRESSINGS) ×3 IMPLANT
PACK TOTAL JOINT (CUSTOM PROCEDURE TRAY) ×3 IMPLANT
PEN SKIN MARKING BROAD (MISCELLANEOUS) ×3 IMPLANT
SAW OSC TIP CART 19.5X105X1.3 (SAW) ×3 IMPLANT
SUT MNCRL AB 4-0 PS2 18 (SUTURE) ×3 IMPLANT
SUT VIC AB 1 CT1 36 (SUTURE) ×9 IMPLANT
SUT VIC AB 2-0 CT1 27 (SUTURE) ×6
SUT VIC AB 2-0 CT1 TAPERPNT 27 (SUTURE) ×2 IMPLANT
SUT VLOC 180 0 24IN GS25 (SUTURE) ×3 IMPLANT
TOWEL OR 17X26 10 PK STRL BLUE (TOWEL DISPOSABLE) ×3 IMPLANT
TOWEL OR NON WOVEN STRL DISP B (DISPOSABLE) ×2 IMPLANT
TRAY FOLEY W/METER SILVER 14FR (SET/KITS/TRAYS/PACK) ×3 IMPLANT
WATER STERILE IRR 1500ML POUR (IV SOLUTION) ×3 IMPLANT
YANKAUER SUCT BULB TIP 10FT TU (MISCELLANEOUS) ×3 IMPLANT

## 2015-06-28 NOTE — Anesthesia Procedure Notes (Signed)
Spinal Patient location during procedure: OR Start time: 06/28/2015 9:49 AM End time: 06/28/2015 9:54 AM Staffing Resident/CRNA: Aaniya Sterba L Performed by: resident/CRNA  Preanesthetic Checklist Completed: patient identified, site marked, surgical consent, pre-op evaluation, timeout performed, IV checked, risks and benefits discussed and monitors and equipment checked Spinal Block Patient position: sitting Prep: Betadine Patient monitoring: heart rate, continuous pulse ox and blood pressure Approach: midline Location: L4-5 Injection technique: single-shot Needle Needle type: Sprotte  Needle gauge: 24 G Needle length: 9 cm Assessment Sensory level: T6 Additional Notes Kit expiration date 09/2016 and lot # [id number] Clear CSF, negative heme, negative paresthesia Tolerated well and returned to supine position

## 2015-06-28 NOTE — Anesthesia Postprocedure Evaluation (Signed)
  Anesthesia Post-op Note  Patient: Tonya Dawson  Procedure(s) Performed: Procedure(s) (LRB): RIGHT TOTAL HIP ARTHROPLASTY ANTERIOR APPROACH (Right)  Patient Location: PACU  Anesthesia Type: Spinal  Level of Consciousness: awake and alert   Airway and Oxygen Therapy: Patient Spontanous Breathing  Post-op Pain: mild  Post-op Assessment: Post-op Vital signs reviewed, Patient's Cardiovascular Status Stable, Respiratory Function Stable, Patent Airway and No signs of Nausea or vomiting  Last Vitals:  Filed Vitals:   06/28/15 1516  BP: 120/65  Pulse: 72  Temp: 36.8 C  Resp: 12    Post-op Vital Signs: stable   Complications: No apparent anesthesia complications

## 2015-06-28 NOTE — Transfer of Care (Signed)
Immediate Anesthesia Transfer of Care Note  Patient: Tonya Dawson  Procedure(s) Performed: Procedure(s): RIGHT TOTAL HIP ARTHROPLASTY ANTERIOR APPROACH (Right)  Patient Location: PACU  Anesthesia Type:Spinal  Level of Consciousness: awake, alert  and oriented  Airway & Oxygen Therapy: Patient Spontanous Breathing and Patient connected to face mask oxygen  Post-op Assessment: Report given to RN and Post -op Vital signs reviewed and stable  Post vital signs: Reviewed and stable  Last Vitals:  Filed Vitals:   06/28/15 0707  BP: 126/86  Pulse: 74  Temp: 36.7 C  Resp: 20    Complications: No apparent anesthesia complications

## 2015-06-28 NOTE — Op Note (Signed)
NAME:  Tonya Dawson                ACCOUNT NO.: [id number]      MEDICAL RECORD NO.: [id number]      FACILITY:  [facility]      PHYSICIAN:  Paralee Cancel D  DATE OF BIRTH:  1951/06/08     DATE OF PROCEDURE:  06/28/2015                                 OPERATIVE REPORT         PREOPERATIVE DIAGNOSIS: Right  hip osteoarthritis.      POSTOPERATIVE DIAGNOSIS:  Right hip osteoarthritis.      PROCEDURE:  Right total hip replacement through an anterior approach   utilizing DePuy THR system, component size 35mm pinnacle cup, a size 36+4 neutral   Altrex liner, a size 2 Hi Tri Lock stem with a 36+1.5 delta ceramic   ball.      SURGEON:  Pietro Cassis. Alvan Dame, M.D.      ASSISTANT:  Danae Orleans, PA-C      ANESTHESIA:  Spinal.      SPECIMENS:  None.      COMPLICATIONS:  None.      BLOOD LOSS:  250 cc     DRAINS:  None.      INDICATION OF THE PROCEDURE:  RUBA OUTEN is a 64 y.o. female who had   presented to office for evaluation of right hip pain.  Radiographs revealed   progressive degenerative changes with bone-on-bone   articulation to the  hip joint.  The patient had painful limited range of   motion significantly affecting their overall quality of life.  The patient was failing to    respond to conservative measures, and at this point was ready   to proceed with more definitive measures.  The patient has noted progressive   degenerative changes in his hip, progressive problems and dysfunction   with regarding the hip prior to surgery.  Consent was obtained for   benefit of pain relief.  Specific risk of infection, DVT, component   failure, dislocation, need for revision surgery, as well discussion of   the anterior versus posterior approach were reviewed.  Consent was   obtained for benefit of anterior pain relief through an anterior   approach.      PROCEDURE IN DETAIL:  The patient was brought to operative theater.   Once adequate anesthesia, preoperative  antibiotics, 2gm of Ancef, 1gm of Tranexamic Acid, and 10 mg of Decadron administered.   The patient was positioned supine on the OSI Hanna table.  Once adequate   padding of boney process was carried out, we had predraped out the hip, and  used fluoroscopy to confirm orientation of the pelvis and position.      The right hip was then prepped and draped from proximal iliac crest to   mid thigh with shower curtain technique.      Time-out was performed identifying the patient, planned procedure, and   extremity.     An incision was then made 2 cm distal and lateral to the   anterior superior iliac spine extending over the orientation of the   tensor fascia lata muscle and sharp dissection was carried down to the   fascia of the muscle and protractor placed in the soft tissues.      The fascia was  then incised.  The muscle belly was identified and swept   laterally and retractor placed along the superior neck.  Following   cauterization of the circumflex vessels and removing some pericapsular   fat, a second cobra retractor was placed on the inferior neck.  A third   retractor was placed on the anterior acetabulum after elevating the   anterior rectus.  A L-capsulotomy was along the line of the   superior neck to the trochanteric fossa, then extended proximally and   distally.  Tag sutures were placed and the retractors were then placed   intracapsular.  We then identified the trochanteric fossa and   orientation of my neck cut, confirmed this radiographically   and then made a neck osteotomy with the femur on traction.  The femoral   head was removed without difficulty or complication.  Traction was let   off and retractors were placed posterior and anterior around the   acetabulum.      The labrum and foveal tissue were debrided.  I began reaming with a 49mm   reamer and reamed up to 46mm reamer with good bony bed preparation and a 24mm   cup was chosen.  The final 79mm Pinnacle cup  was then impacted under fluoroscopy  to confirm the depth of penetration and orientation with respect to   abduction.  A screw was placed followed by the hole eliminator.  The final   36+4 neutral Altrex liner was impacted with good visualized rim fit.  The cup was positioned anatomically within the acetabular portion of the pelvis.      At this point, the femur was rolled at 80 degrees.  Further capsule was   released off the inferior aspect of the femoral neck.  I then   released the superior capsule proximally.  The hook was placed laterally   along the femur and elevated manually and held in position with the bed   hook.  The leg was then extended and adducted with the leg rolled to 100   degrees of external rotation.  Once the proximal femur was fully   exposed, I used a box osteotome to set orientation.  I then began   broaching with the starting chili pepper broach and passed this by hand and then broached up to 2.  With the 2 broach in place I chose a high offset neck to try and best re-establish both offset and length and did several trial reductions.  The offset was appropriate, leg lengths   appeared to be equal, confirmed radiographically.   Given these findings, I went ahead and dislocated the hip, repositioned all   retractors and positioned the right hip in the extended and abducted position.  The final 2 Hi Tri Lock stem was   chosen and it was impacted down to the level of neck cut.  Based on this   and the trial reduction, a 36+1.5 delta ceramic ball was chosen and   impacted onto a clean and dry trunnion, and the hip was reduced.  The   hip had been irrigated throughout the case again at this point.  I did   reapproximate the superior capsular leaflet to the anterior leaflet   using #1 Vicryl.  The fascia of the   tensor fascia lata muscle was then reapproximated using #1 Vicryl and #0 V-lock sutures.  The   remaining wound was closed with 2-0 Vicryl and running 4-0  Monocryl.   The hip was cleaned,  dried, and dressed sterilely using Dermabond and   Aquacel dressing.  She was then brought   to recovery room in stable condition tolerating the procedure well.    Danae Orleans, PA-C was present for the entirety of the case involved from   preoperative positioning, perioperative retractor management, general   facilitation of the case, as well as primary wound closure as assistant.            Pietro Cassis Alvan Dame, M.D.        06/28/2015 11:26 AM

## 2015-06-28 NOTE — Anesthesia Preprocedure Evaluation (Signed)
Anesthesia Evaluation  Patient identified by MRN, date of birth, ID band Patient awake    Reviewed: Allergy & Precautions, H&P , NPO status , Patient's Chart, lab work & pertinent test results  Airway Mallampati: II  TM Distance: >3 FB Neck ROM: full    Dental no notable dental hx. (+) Teeth Intact, Dental Advisory Given   Pulmonary neg pulmonary ROS,  breath sounds clear to auscultation  Pulmonary exam normal       Cardiovascular Exercise Tolerance: Good negative cardio ROS Normal cardiovascular examRhythm:regular Rate:Normal     Neuro/Psych negative neurological ROS  negative psych ROS   GI/Hepatic negative GI ROS, Neg liver ROS,   Endo/Other  negative endocrine ROSHypothyroidism   Renal/GU negative Renal ROS  negative genitourinary   Musculoskeletal   Abdominal   Peds  Hematology negative hematology ROS (+)   Anesthesia Other Findings   Reproductive/Obstetrics negative OB ROS                             Anesthesia Physical Anesthesia Plan  ASA: II  Anesthesia Plan: Spinal   Post-op Pain Management:    Induction:   Airway Management Planned:   Additional Equipment:   Intra-op Plan:   Post-operative Plan:   Informed Consent: I have reviewed the patients History and Physical, chart, labs and discussed the procedure including the risks, benefits and alternatives for the proposed anesthesia with the patient or authorized representative who has indicated his/her understanding and acceptance.   Dental Advisory Given  Plan Discussed with: CRNA and Surgeon  Anesthesia Plan Comments:         Anesthesia Quick Evaluation

## 2015-06-28 NOTE — Evaluation (Signed)
Physical Therapy Evaluation Patient Details Name: Tonya Dawson MRN: [id number] DOB: 1951-08-30 Today's Date: 06/28/2015   History of Present Illness  s/p R DA THA  Clinical Impression  Pt admitted with above diagnosis. Pt currently with functional limitations due to the deficits listed below (see PT Problem List).  Pt will benefit from skilled PT to increase their independence and safety with mobility to allow discharge to the venue listed below.   Pt should progress well, will follow; Plan is for HHPT, has borrowed RW, may want 3in1     Follow Up Recommendations Home health PT    Equipment Recommendations  None recommended by PT    Recommendations for Other Services       Precautions / Restrictions Restrictions Weight Bearing Restrictions: No Other Position/Activity Restrictions: WBAT      Mobility  Bed Mobility Overal bed mobility: Needs Assistance Bed Mobility: Supine to Sit;Sit to Supine     Supine to sit: Min assist Sit to supine: Min assist   General bed mobility comments: incr time, cues for technique  Transfers Overall transfer level: Needs assistance Equipment used: Rolling walker (2 wheeled) Transfers: Sit to/from Stand Sit to Stand: Min guard         General transfer comment: cues for hand placement; pt states she "feels shaky and weird because of not eating" pt preferred to return to bed  Ambulation/Gait                Stairs            Wheelchair Mobility    Modified Rankin (Stroke Patients Only)       Balance                                             Pertinent Vitals/Pain Pain Assessment: 0-10 Pain Score: 2  Pain Location: R hip Pain Descriptors / Indicators: Sore Pain Intervention(s): Limited activity within patient's tolerance    Home Living Family/patient expects to be discharged to:: Private residence Living Arrangements: Spouse/significant other   Type of Home: House Home Access: Stairs to  enter   Technical brewer of Steps: 4 Home Layout: Two level;Able to live on main level with bedroom/bathroom Home Equipment: Gilford Rile - 2 wheels      Prior Function Level of Independence: Independent               Hand Dominance        Extremity/Trunk Assessment   Upper Extremity Assessment: Defer to OT evaluation           Lower Extremity Assessment: RLE deficits/detail RLE Deficits / Details: AAROM WFL, knee and hip grossly 2+/5 to 3/5       Communication   Communication: No difficulties  Cognition Arousal/Alertness: Awake/alert Behavior During Therapy: WFL for tasks assessed/performed Overall Cognitive Status: Within Functional Limits for tasks assessed                      General Comments      Exercises Total Joint Exercises Ankle Circles/Pumps: AROM;Both;5 reps      Assessment/Plan    PT Assessment Patient needs continued PT services  PT Diagnosis Difficulty walking   PT Problem List Decreased strength;Decreased activity tolerance;Decreased balance;Decreased mobility  PT Treatment Interventions DME instruction;Gait training;Functional mobility training;Therapeutic activities;Therapeutic exercise;Patient/family education;Stair training   PT Goals (Current goals can be found  in the Care Plan section) Acute Rehab PT Goals Patient Stated Goal: regain function with less pain PT Goal Formulation: With patient Time For Goal Achievement: 07/05/15 Potential to Achieve Goals: Good    Frequency 7X/week   Barriers to discharge        Co-evaluation               End of Session Equipment Utilized During Treatment: Gait belt Activity Tolerance: Patient tolerated treatment well Patient left: in bed;with call bell/phone within reach;with family/visitor present Nurse Communication: Mobility status         Time: 9747-1855 PT Time Calculation (min) (ACUTE ONLY): 27 min   Charges:   PT Evaluation $Initial PT Evaluation Tier I: 1  Procedure PT Treatments $Therapeutic Activity: 8-22 mins   PT G Codes:        Charae Depaolis July 11, 2015, 4:29 PM

## 2015-06-28 NOTE — Progress Notes (Signed)
Utilization review completed.  

## 2015-06-28 NOTE — Interval H&P Note (Signed)
History and Physical Interval Note:  06/28/2015 8:30 AM  Tonya Dawson  has presented today for surgery, with the diagnosis of RIGHT HIP OA  The various methods of treatment have been discussed with the patient and family. After consideration of risks, benefits and other options for treatment, the patient has consented to  Procedure(s): RIGHT TOTAL HIP ARTHROPLASTY ANTERIOR APPROACH (Right) as a surgical intervention .  The patient's history has been reviewed, patient examined, no change in status, stable for surgery.  I have reviewed the patient's chart and labs.  Questions were answered to the patient's satisfaction.     Mauri Pole

## 2015-06-29 ENCOUNTER — Encounter (HOSPITAL_COMMUNITY): Payer: Self-pay | Admitting: Orthopedic Surgery

## 2015-06-29 LAB — CBC
HEMATOCRIT: 34.5 % — AB (ref 36.0–46.0)
HEMOGLOBIN: 11.8 g/dL — AB (ref 12.0–15.0)
MCH: 32 pg (ref 26.0–34.0)
MCHC: 34.2 g/dL (ref 30.0–36.0)
MCV: 93.5 fL (ref 78.0–100.0)
PLATELETS: 208 10*3/uL (ref 150–400)
RBC: 3.69 MIL/uL — AB (ref 3.87–5.11)
RDW: 12.1 % (ref 11.5–15.5)
WBC: 11.2 10*3/uL — AB (ref 4.0–10.5)

## 2015-06-29 LAB — BASIC METABOLIC PANEL
ANION GAP: 5 (ref 5–15)
BUN: 17 mg/dL (ref 6–20)
CALCIUM: 8.5 mg/dL — AB (ref 8.9–10.3)
CHLORIDE: 107 mmol/L (ref 101–111)
CO2: 26 mmol/L (ref 22–32)
CREATININE: 0.83 mg/dL (ref 0.44–1.00)
GFR calc Af Amer: >60 mL/min (ref >60)
GLUCOSE: 139 mg/dL — AB (ref 65–99)
Potassium: 5.3 mmol/L — ABNORMAL HIGH (ref 3.5–5.1)
Sodium: 138 mmol/L (ref 135–145)

## 2015-06-29 MED ORDER — ASPIRIN 325 MG PO TBEC: 325.0000 mg | DELAYED_RELEASE_TABLET | Freq: Two times a day (BID) | ORAL | Status: AC

## 2015-06-29 MED ORDER — POLYETHYLENE GLYCOL 3350 17 G PO PACK
17.0000 g | PACK | Freq: Two times a day (BID) | ORAL | Status: DC
Start: 2015-06-29 — End: 2017-07-10

## 2015-06-29 MED ORDER — DOCUSATE SODIUM 100 MG PO CAPS: 100.0000 mg | ORAL_CAPSULE | Freq: Two times a day (BID) | ORAL | Status: DC

## 2015-06-29 MED ORDER — HYDROCODONE-ACETAMINOPHEN 7.5-325 MG PO TABS: 1.0000 | ORAL_TABLET | ORAL | Status: DC | PRN

## 2015-06-29 MED ORDER — FERROUS SULFATE 325 (65 FE) MG PO TABS: 325.0000 mg | ORAL_TABLET | Freq: Three times a day (TID) | ORAL | Status: DC

## 2015-06-29 MED ORDER — METHOCARBAMOL 500 MG PO TABS: 500.0000 mg | ORAL_TABLET | Freq: Four times a day (QID) | ORAL | Status: DC | PRN

## 2015-06-29 NOTE — Progress Notes (Signed)
Physical Therapy Treatment Patient Details Name: Tonya Dawson MRN: [id number] DOB: 02-14-1951 Today's Date: Jul 25, 2015    History of Present Illness s/p R DA THA    PT Comments    Pt slow with mobility and reports nausea however only min/guard assist.  Pt ambulated and practiced stairs.  Will return for afternoon session.  Possible d/c later today.  Follow Up Recommendations  Home health PT     Equipment Recommendations  None recommended by PT    Recommendations for Other Services       Precautions / Restrictions Precautions Precautions: Fall Restrictions Weight Bearing Restrictions: No Other Position/Activity Restrictions: WBAT    Mobility  Bed Mobility Overal bed mobility: Needs Assistance Bed Mobility: Supine to Sit     Supine to sit: Supervision Sit to supine: Min assist   General bed mobility comments: verbal cues for self assist R LE  Transfers Overall transfer level: Needs assistance Equipment used: Rolling walker (2 wheeled) Transfers: Sit to/from Stand Sit to Stand: Min guard         General transfer comment: cues for hand placement; min guard for safety  Ambulation/Gait Ambulation/Gait assistance: Min guard Ambulation Distance (Feet): 80 Feet Assistive device: Rolling walker (2 wheeled) Gait Pattern/deviations: Step-to pattern;Step-through pattern;Antalgic Gait velocity: decr   General Gait Details: verbal cues for sequence, RW positioning, step length, reports mild nausea with mobility   Stairs Stairs: Yes Stairs assistance: Min guard Stair Management: Step to pattern;Backwards;With walker Number of Stairs: 2 General stair comments: verbal cues for safety, sequence, RW placement  Wheelchair Mobility    Modified Rankin (Stroke Patients Only)       Balance                                    Cognition Arousal/Alertness: Awake/alert Behavior During Therapy: WFL for tasks assessed/performed Overall Cognitive Status:  Within Functional Limits for tasks assessed                      Exercises      General Comments        Pertinent Vitals/Pain Pain Assessment: 0-10 Pain Score: 3  Pain Location: R hip Pain Descriptors / Indicators: Sore Pain Intervention(s): Limited activity within patient's tolerance;Monitored during session;Premedicated before session    Home Living Family/patient expects to be discharged to:: Private residence Living Arrangements: Spouse/significant other           Home Equipment: Environmental consultant - 2 wheels      Prior Function Level of Independence: Independent          PT Goals (current goals can now be found in the care plan section) Acute Rehab PT Goals Patient Stated Goal: regain function with less pain Progress towards PT goals: Progressing toward goals    Frequency  7X/week    PT Plan Current plan remains appropriate    Co-evaluation             End of Session Equipment Utilized During Treatment: Gait belt Activity Tolerance: Patient tolerated treatment well Patient left:  (with OT going into bathroom)     Time: 1012-1030 PT Time Calculation (min) (ACUTE ONLY): 18 min  Charges:  $Gait Training: 8-22 mins                    G Codes:      Stepen Prins,KATHrine E July 25, 2015, 1:06 PM Carmelia Bake, PT, DPT 2015/07/25  Pager: [phone]

## 2015-06-29 NOTE — Progress Notes (Signed)
Physical Therapy Treatment Note    06/29/15 1400  PT Visit Information  Last PT Received On 06/29/15  Assistance Needed +1  History of Present Illness s/p R DA THA  PT Time Calculation  PT Start Time (ACUTE ONLY) 1329  PT Stop Time (ACUTE ONLY) 1354  PT Time Calculation (min) (ACUTE ONLY) 25 min  Subjective Data  Subjective Pt ambulated in hallway and practiced safe stair technique, also provided stair handout.  Pt performed LE exercises as well.  Pt had no further questions and ready for d/c home with spouse today.  Precautions  Precautions Fall  Restrictions  Other Position/Activity Restrictions WBAT  Pain Assessment  Pain Assessment 0-10  Pain Score 2  Pain Location R hip  Pain Descriptors / Indicators Sore  Pain Intervention(s) Limited activity within patient's tolerance;Monitored during session  Cognition  Arousal/Alertness Awake/alert  Behavior During Therapy WFL for tasks assessed/performed  Overall Cognitive Status Within Functional Limits for tasks assessed  Bed Mobility  Overal bed mobility Needs Assistance  Bed Mobility Supine to Sit  Supine to sit Supervision  Transfers  Overall transfer level Needs assistance  Equipment used Rolling walker (2 wheeled)  Transfers Sit to/from Stand  Sit to Stand Supervision  General transfer comment supervision for safety  Ambulation/Gait  Ambulation/Gait assistance Supervision  Ambulation Distance (Feet) 80 Feet  Assistive device Rolling walker (2 wheeled)  General Gait Details verbal cues for RW positioning, decreasing R toe in  Gait Pattern/deviations Step-through pattern;Antalgic  Gait velocity decr  Stairs Yes  Stairs assistance Min guard  Stair Management Step to pattern;Backwards;With walker  Number of Stairs 2  General stair comments verbal cues for safety, sequence and RW positioning, pt with difficulty remembering from this morning, provided handout for spouse to review for assist (spouse not present)  Exercises   Exercises Total Joint  Total Joint Exercises  Ankle Circles/Pumps AROM;10 reps;Both  Quad Sets AROM;Both;10 reps  Towel Squeeze AROM;Both;10 reps  Short Arc Quad AROM;Right;10 reps  Heel Slides AAROM;Right;10 reps  Hip ABduction/ADduction AAROM;Right;10 reps  PT - End of Session  Activity Tolerance Patient tolerated treatment well  Patient left in bed;with call bell/phone within reach  PT - Assessment/Plan  PT Plan Current plan remains appropriate  PT Frequency (ACUTE ONLY) 7X/week  Follow Up Recommendations Home health PT  PT equipment None recommended by PT  PT Goal Progression  Progress towards PT goals Progressing toward goals  PT General Charges  $$ ACUTE PT VISIT 1 Procedure  PT Treatments  $Gait Training 8-22 mins  $Therapeutic Exercise 8-22 mins   Carmelia Bake, PT, DPT 06/29/2015 Pager: [phone]

## 2015-06-29 NOTE — Care Management Note (Signed)
Case Management Note  Patient Details  Name: STEFANEE MCKELL MRN: [id number] Date of Birth: 1951-08-05  Subjective/Objective:                   RIGHT TOTAL HIP ARTHROPLASTY ANTERIOR APPROACH (Right) Action/Plan:  Discharge planning Expected Discharge Date:  06/29/15               Expected Discharge Plan:  [place]  In-House Referral:     Discharge planning Services  CM Consult  Post Acute Care Choice:  Home Health Choice offered to:     DME Arranged:  3-N-1 DME Agency:  [place]:  PT [facility] Agency:  [place]  Status of Service:  Completed, signed off  Medicare Important Message Given:    Date Medicare IM Given:    Medicare IM give by:    Date Additional Medicare IM Given:    Additional Medicare Important Message give by:     If discussed at [place] of Stay Meetings, dates discussed:    Additional Comments: CM met with pt in room to offer choice of home health agency.  Pt chooses [facility] to render HHPT.  Address and contact information verified by pt.  Referral texted to [place] rep, Tim.  CM called [facility] DME rep, Lecretia to please deliver the 3n1 to room prior to dishcarge.  Pt has borrowed rolling walker and has it at home.  No other CM needs were communicated. Dellie Catholic, RN 06/29/2015, 12:23 PM

## 2015-06-29 NOTE — Discharge Instructions (Signed)

## 2015-06-29 NOTE — Progress Notes (Signed)
     Subjective: 1 Day Post-Op Procedure(s) (LRB): RIGHT TOTAL HIP ARTHROPLASTY ANTERIOR APPROACH (Right)   Seen by Dr. Alvan Dame. Patient reports pain as mild, pain controlled. No events throughout the night. Ready to be discharged home if she does well with PT.  Objective:   VITALS:   Filed Vitals:   06/29/15  BP: 111/59  Pulse: 61  Temp: 99 F (37.2 C)   Resp: 16    Dorsiflexion/Plantar flexion intact Incision: dressing C/D/I No cellulitis present Compartment soft  LABS  Recent Labs  06/29/15 0408  HGB 11.8*  HCT 34.5*  WBC 11.2*  PLT 208     Recent Labs  06/29/15 0408  NA 138  K 5.3*  BUN 17  CREATININE 0.83  GLUCOSE 139*     Assessment/Plan: 1 Day Post-Op Procedure(s) (LRB): RIGHT TOTAL HIP ARTHROPLASTY ANTERIOR APPROACH (Right) Foley cath d/c'ed Advance diet Up with therapy D/C IV fluids Discharge home with home health  Follow up in 2 weeks at [facility]. Follow up with OLIN,Ezra Denne D in 2 weeks.  Contact information:  [facility] [address], [address] [place] Ludivina Guymon   PAC  06/29/2015, 9:06 AM

## 2015-06-29 NOTE — Evaluation (Signed)
Occupational Therapy Evaluation Patient Details Name: Tonya Dawson MRN: [id number] DOB: 1951/06/05 Today's Date: 06/29/2015    History of Present Illness s/p R DA THA   Clinical Impression   This 64 year old female was admitted for the above surgery.  All education was completed.  No further OT is needed at this time.    Follow Up Recommendations  Supervision/Assistance - 24 hour    Equipment Recommendations  3 in 1 bedside comode    Recommendations for Other Services       Precautions / Restrictions Precautions Precautions: Fall Restrictions Weight Bearing Restrictions: No      Mobility Bed Mobility           Sit to supine: Min assist   General bed mobility comments: assist for RLE  Transfers   Equipment used: Rolling walker (2 wheeled) Transfers: Sit to/from Stand Sit to Stand: Min guard         General transfer comment: cues for hand placement; min guard for safety    Balance                                            ADL Overall ADL's : Needs assistance/impaired     Grooming: Wash/dry hands;Set up;Sitting   Upper Body Bathing: Set up;Sitting   Lower Body Bathing: Moderate assistance;Sit to/from stand   Upper Body Dressing : Set up;Sitting   Lower Body Dressing: Maximal assistance;Sit to/from stand   Toilet Transfer: Min guard;Ambulation;BSC   Toileting- Water quality scientist and Hygiene: Min guard;Sit to/from stand         General ADL Comments: pt feels a little nauseous, but wanted to use bathroom and wash up a little prior to lying down.  Husband will assist as needed and educated on AE, if desired.  Also educated on sturdy step stool for into high bed and use of belt or sheet to assist with leg if she wants to do this herself.  She will sponge bathe and wash hair at sink     Vision     Perception     Praxis      Pertinent Vitals/Pain Pain Score: 2  Pain Location: R hip Pain Descriptors / Indicators:  Sore Pain Intervention(s): Limited activity within patient's tolerance;Monitored during session;Premedicated before session;Repositioned     Hand Dominance     Extremity/Trunk Assessment Upper Extremity Assessment Upper Extremity Assessment: Overall WFL for tasks assessed           Communication Communication Communication: No difficulties   Cognition Arousal/Alertness: Awake/alert Behavior During Therapy: WFL for tasks assessed/performed Overall Cognitive Status: Within Functional Limits for tasks assessed                     General Comments       Exercises       Shoulder Instructions      Home Living Family/patient expects to be discharged to:: Private residence Living Arrangements: Spouse/significant other                 Bathroom Shower/Tub:  (shower is upstairs)   Biochemist, clinical: Standard     Home Equipment: Environmental consultant - 2 wheels          Prior Functioning/Environment Level of Independence: Independent             OT Diagnosis: Generalized weakness   OT Problem List:  OT Treatment/Interventions:      OT Goals(Current goals can be found in the care plan section) Acute Rehab OT Goals Patient Stated Goal: regain function with less pain  OT Frequency:     Barriers to D/C:            Co-evaluation              End of Session    Activity Tolerance: Patient tolerated treatment well Patient left: in bed;with call bell/phone within reach   Time: 1028-1039 OT Time Calculation (min): 11 min Charges:  OT General Charges $OT Visit: 1 Procedure OT Evaluation $Initial OT Evaluation Tier I: 1 Procedure G-Codes:    Kayleana Waites Jul 09, 2015, 10:57 AM  Lesle Chris, OTR/L [phone] 2015-07-09

## 2015-06-29 NOTE — Progress Notes (Signed)
RN reviewed discharge instructions with patient and family. All questions answered.  Paperwork and prescriptions given.   NT rolled patient down in wheelchair to family car.  

## 2015-07-07 NOTE — Discharge Summary (Signed)
Physician Discharge Summary  Patient ID: Tonya Dawson MRN: [id number] DOB/AGE: 30-Sep-1951 26 y.o.  Admit date: 06/28/2015 Discharge date: 06/29/2015   Procedures:  Procedure(s) (LRB): RIGHT TOTAL HIP ARTHROPLASTY ANTERIOR APPROACH (Right)  Attending Physician:  Dr. Paralee Cancel   Admission Diagnoses:   Right hip primary OA / pain  Discharge Diagnoses:  Principal Problem:   S/P right THA, AA  Past Medical History  Diagnosis Date  . Hypothyroidism   . Arthritis   . Family history of adverse reaction to anesthesia     pt states mother developed dementia from anesthesia at age 25  . Heart murmur   . Headache     hx of migraines last one 12 years ago     HPI:    Tonya Dawson, 64 y.o. female, has a history of pain and functional disability in the right hip(s) due to arthritis and patient has failed non-surgical conservative treatments for greater than 12 weeks to include NSAID's and/or analgesics, corticosteriod injections and activity modification. Onset of symptoms was gradual starting 5 years ago with gradually worsening course since that time.The patient noted no past surgery on the right hip(s). Patient currently rates pain in the right hip at 10 out of 10 with activity. Patient has worsening of pain with activity and weight bearing, trendelenberg gait, pain that interfers with activities of daily living and pain with passive range of motion. Patient has evidence of periarticular osteophytes and joint space narrowing by imaging studies. This condition presents safety issues increasing the risk of falls. There is no current active infection. Risks, benefits and expectations were discussed with the patient. Risks including but not limited to the risk of anesthesia, blood clots, nerve damage, blood vessel damage, failure of the prosthesis, infection and up to and including death. Patient understand the risks, benefits and expectations and wishes to proceed with surgery.   PCP:  Horatio Pel, MD   Discharged Condition: good  Hospital Course:  Patient underwent the above stated procedure on 06/28/2015. Patient tolerated the procedure well and brought to the recovery room in good condition and subsequently to the floor.  POD #1 BP: 111/59 ; Pulse: 61 ; Temp: 99 F (37.2 C) ; Resp: 16 Patient reports pain as mild, pain controlled. No events throughout the night. Ready to be discharged home. Dorsiflexion/plantar flexion intact, incision: dressing C/D/I, no cellulitis present and compartment soft.   LABS  Basename    HGB  11.8  HCT  34.5    Discharge Exam: General appearance: alert, cooperative and no distress Extremities: Homans sign is negative, no sign of DVT, no edema, redness or tenderness in the calves or thighs and no ulcers, gangrene or trophic changes  Disposition: Home with follow up in 2 weeks   Follow-up Information    Follow up with Mauri Pole, MD. Schedule an appointment as soon as possible for a visit in 2 weeks.   Specialty:  Orthopedic Surgery   Contact information:   [address] [place] [address] [phone]       Follow up with [facility].   Why:  home health physical therapy   Contact information:   [address] [place] [address] [phone]       Follow up with [facility].   Why:  3n1 (over the commode seat)   Contact information:   [address] [place]  [phone]       Discharge  Instructions    Call MD / Call 911    Complete by:  As directed   If you experience chest pain or shortness of breath, CALL 911 and be transported to the hospital emergency room.  If you develope a fever above 101 F, pus (white drainage) or increased drainage or redness at the wound, or calf pain, call your surgeon's office.     Change dressing    Complete by:  As directed   Maintain surgical dressing until follow up in the clinic. If the  edges start to pull up, may reinforce with tape. If the dressing is no longer working, may remove and cover with gauze and tape, but must keep the area dry and clean.  Call with any questions or concerns.     Constipation Prevention    Complete by:  As directed   Drink plenty of fluids.  Prune juice may be helpful.  You may use a stool softener, such as Colace (over the counter) 100 mg twice a day.  Use MiraLax (over the counter) for constipation as needed.     Diet - low sodium heart healthy    Complete by:  As directed      Discharge instructions    Complete by:  As directed   Maintain surgical dressing until follow up in the clinic. If the edges start to pull up, may reinforce with tape. If the dressing is no longer working, may remove and cover with gauze and tape, but must keep the area dry and clean.  Follow up in 2 weeks at [facility]. Call with any questions or concerns.     Increase activity slowly as tolerated    Complete by:  As directed      TED hose    Complete by:  As directed   Use stockings (TED hose) for 2 weeks on both leg(s).  You may remove them at night for sleeping.     Weight bearing as tolerated    Complete by:  As directed   Laterality:  right  Extremity:  Lower             Medication List    STOP taking these medications        ibuprofen 200 MG tablet  Commonly known as:  ADVIL,MOTRIN      TAKE these medications        aspirin 325 MG EC tablet  Take 1 tablet (325 mg total) by mouth 2 (two) times daily.     CALCIUM 600 PO  Take 1 tablet by mouth at bedtime.     cholecalciferol 1000 UNITS tablet  Commonly known as:  VITAMIN D  Take 1,000 Units by mouth daily as needed (during fall/winter.).     Co Q 10 10 MG Caps  Take 1 capsule by mouth every morning.     docusate sodium 100 MG capsule  Commonly known as:  COLACE  Take 1 capsule (100 mg total) by mouth 2 (two) times daily.     ferrous sulfate 325 (65 FE) MG tablet  Take 1  tablet (325 mg total) by mouth 3 (three) times daily after meals.     FISH OIL PO  Take 1 tablet by mouth every morning.     HYDROcodone-acetaminophen 7.5-325 MG per tablet  Commonly known as:  NORCO  Take 1-2 tablets by mouth every 4 (four) hours as needed for moderate pain.     levothyroxine 100 MCG tablet  Commonly known as:  SYNTHROID, LEVOTHROID  Take 100 mcg by mouth daily before breakfast.     MAGNESIUM PO  Take 1 tablet by mouth at bedtime.     methocarbamol 500 MG tablet  Commonly known as:  ROBAXIN  Take 1 tablet (500 mg total) by mouth every 6 (six) hours as needed for muscle spasms.     multivitamin capsule  Take 1 capsule by mouth every morning.     polyethylene glycol packet  Commonly known as:  MIRALAX / GLYCOLAX  Take 17 g by mouth 2 (two) times daily.         Signed: [place] Pugh. Cylah Fannin   PA-C  07/07/2015, 11:22 AM

## 2015-11-14 ENCOUNTER — Other Ambulatory Visit (HOSPITAL_COMMUNITY)
Admission: RE | Admit: 2015-11-14 | Discharge: 2015-11-14 | Disposition: A | Discharge status: Home / Self Care | Payer: BLUE CROSS/BLUE SHIELD | Source: Ambulatory Visit | Attending: Internal Medicine | Admitting: Internal Medicine

## 2015-11-14 DIAGNOSIS — Z01419 Encounter for gynecological examination (general) (routine) without abnormal findings: Secondary | ICD-10-CM | POA: Insufficient documentation

## 2016-03-21 DIAGNOSIS — E039 Hypothyroidism, unspecified: Secondary | ICD-10-CM | POA: Diagnosis not present

## 2016-10-30 DIAGNOSIS — E039 Hypothyroidism, unspecified: Secondary | ICD-10-CM | POA: Diagnosis not present

## 2016-10-30 DIAGNOSIS — N390 Urinary tract infection, site not specified: Secondary | ICD-10-CM | POA: Diagnosis not present

## 2016-10-30 DIAGNOSIS — Z0000 Encounter for general adult medical examination without abnormal findings: Secondary | ICD-10-CM | POA: Diagnosis not present

## 2016-10-30 DIAGNOSIS — E559 Vitamin D deficiency, unspecified: Secondary | ICD-10-CM | POA: Diagnosis not present

## 2017-01-31 DIAGNOSIS — Z0000 Encounter for general adult medical examination without abnormal findings: Secondary | ICD-10-CM | POA: Diagnosis not present

## 2017-01-31 DIAGNOSIS — Z23 Encounter for immunization: Secondary | ICD-10-CM | POA: Diagnosis not present

## 2017-01-31 DIAGNOSIS — E039 Hypothyroidism, unspecified: Secondary | ICD-10-CM | POA: Diagnosis not present

## 2017-07-10 ENCOUNTER — Ambulatory Visit (INDEPENDENT_AMBULATORY_CARE_PROVIDER_SITE_OTHER): Payer: Medicare Other | Admitting: Physician Assistant

## 2017-07-10 ENCOUNTER — Other Ambulatory Visit: Payer: Self-pay | Admitting: Physician Assistant

## 2017-07-10 ENCOUNTER — Encounter: Payer: Self-pay | Admitting: Physician Assistant

## 2017-07-10 VITALS — BP 132/82 | HR 72 | Temp 98.9°F | Resp 18 | Ht 64.57 in | Wt 143.8 lb

## 2017-07-10 DIAGNOSIS — H9193 Unspecified hearing loss, bilateral: Secondary | ICD-10-CM

## 2017-07-10 DIAGNOSIS — H9313 Tinnitus, bilateral: Secondary | ICD-10-CM

## 2017-07-10 DIAGNOSIS — H9191 Unspecified hearing loss, right ear: Secondary | ICD-10-CM | POA: Diagnosis not present

## 2017-07-10 DIAGNOSIS — R0981 Nasal congestion: Secondary | ICD-10-CM | POA: Diagnosis not present

## 2017-07-10 MED ORDER — PREDNISONE 20 MG PO TABS: ORAL_TABLET | ORAL | 0 refills | Status: DC

## 2017-07-10 MED ORDER — CETIRIZINE HCL 10 MG PO TABS: 10.0000 mg | ORAL_TABLET | Freq: Every day | ORAL | 2 refills | Status: DC

## 2017-07-10 MED ORDER — FLUTICASONE PROPIONATE 50 MCG/ACT NA SUSP: 2.0000 | Freq: Every day | NASAL | 1 refills | Status: DC

## 2017-07-10 NOTE — Patient Instructions (Addendum)
Please attempt to take these medications as prescribed.  Take the prednisone in the morning with breakfast. You will let me know if your symptoms do not improve.  Hearing Loss Hearing loss is a partial or total loss of the ability to hear. This can be temporary or permanent, and it can happen in one or both ears. Hearing loss may be referred to as deafness. Medical care is necessary to treat hearing loss properly and to prevent the condition from getting worse. Your hearing may partially or completely come back, depending on what caused your hearing loss and how severe it is. In some cases, hearing loss is permanent. What are the causes? Common causes of hearing loss include:  Too much wax in the ear canal.  Infection of the ear canal or middle ear.  Fluid in the middle ear.  Injury to the ear or surrounding area.  An object stuck in the ear.  Prolonged exposure to loud sounds, such as music.  Less common causes of hearing loss include:  Tumors in the ear.  Viral or bacterial infections, such as meningitis.  A hole in the eardrum (perforated eardrum).  Problems with the hearing nerve that sends signals between the brain and the ear.  Certain medicines.  What are the signs or symptoms? Symptoms of this condition may include:  Difficulty telling the difference between sounds.  Difficulty following a conversation when there is background noise.  Lack of response to sounds in your environment. This may be most noticeable when you do not respond to startling sounds.  Needing to turn up the volume on the television, radio, etc.  Ringing in the ears.  Dizziness.  Pain in the ears.  How is this diagnosed? This condition is diagnosed based on a physical exam and a hearing test (audiometry). The audiometry test will be performed by a hearing specialist (audiologist). You may also be referred to an ear, nose, and throat (ENT) specialist (otolaryngologist). How is this  treated? Treatment for recent onset of hearing loss may include:  Ear wax removal.  Being prescribed medicines to prevent infection (antibiotics).  Being prescribed medicines to reduce inflammation (corticosteroids).  Follow these instructions at home:  If you were prescribed an antibiotic medicine, take it as told by your health care provider. Do not stop taking the antibiotic even if you start to feel better.  Take over-the-counter and prescription medicines only as told by your health care provider.  Avoid loud noises.  Return to your normal activities as told by your health care provider. Ask your health care provider what activities are safe for you.  Keep all follow-up visits as told by your health care provider. This is important. Contact a health care provider if:  You feel dizzy.  You develop new symptoms.  You vomit or feel nauseous.  You have a fever. Get help right away if:  You develop sudden changes in your vision.  You have severe ear pain.  You have new or increased weakness.  You have a severe headache. This information is not intended to replace advice given to you by your health care provider. Make sure you discuss any questions you have with your health care provider. Document Released: 12/10/2005 Document Revised: 05/17/2016 Document Reviewed: 04/27/2015 Elsevier Interactive Patient Education  2018 [employer].     IF you received an x-ray today, you will receive an invoice from [facility]. Please contact [facility] at [phone] with questions or concerns regarding your invoice.   IF  you received labwork today, you will receive an invoice from [facility]. Please contact [facility] at [phone] with questions or concerns regarding your invoice.   Our billing staff will not be able to assist you with questions regarding bills from these companies.  You will be contacted with the lab results as soon as they are available.  The fastest way to get your results is to activate your My Chart account. Instructions are located on the last page of this paperwork. If you have not heard from [place] regarding the results in 2 weeks, please contact this office.

## 2017-07-11 NOTE — Progress Notes (Signed)
PRIMARY CARE AT [facility] [address], [place] [address] [phone]  Date:  07/10/2017   Name:  Tonya Dawson   DOB:  21-Sep-1951   MRN:  [id number]  PCP:  Deland Pretty, MD    History of Present Illness:  Tonya Dawson is a 66 y.o. female patient who presents to PCP with  Chief Complaint  Patient presents with  . Hearing Problem    had water in ears a couple weeks ago and wants ears to be checked      Patient reports that she has had difficulty with hearing for the last 2-3 weeks.  She reports the sensation is in both ears.  She feels as if both ears are clogged.  She denies fever or UR symptoms.  She reports that she does have variant allergy symptoms which come and go.  No drainage from the ears.  She recently went swimming and thought there might be water stuck in her ear.  She reports that she has had tinnitus for years, and feels as if there is mild dysequilibrium within the last week.   Tinnitus has been for years.  Husband complains that patient can not hear him.  She has difficulty hearing in crowds but not in silence.  She has discussed tinnitus with pcp, but has not pursued any treatments.  She scheduled ENT herself in the next 3 days.  She will hear her pulse when she is laying at night. She had one trauma to the bridge of her nose by a horse possibly prior to her symptoms.  This was never checked.      Patient Active Problem List   Diagnosis Date Noted  . S/P right THA, AA 06/28/2015    Past Medical History:  Diagnosis Date  . Arthritis   . Family history of adverse reaction to anesthesia    pt states mother developed dementia from anesthesia at age 77  . Headache    hx of migraines last one 12 years ago   . Heart murmur   . Hypothyroidism     Past Surgical History:  Procedure Laterality Date  . TONSILLECTOMY    . TOTAL HIP ARTHROPLASTY Right 06/28/2015   Procedure: RIGHT TOTAL HIP ARTHROPLASTY ANTERIOR APPROACH;  Surgeon: Paralee Cancel, MD;  Location: [facility];   Service: Orthopedics;  Laterality: Right;  . [place] EXTRACTION  1973    Social History  Substance Use Topics  . Smoking status: Never Smoker  . Smokeless tobacco: Never Used  . Alcohol use 3.6 oz/week    6 Glasses of wine per week     Comment: socially     Family History  Problem Relation Age of Onset  . Colon cancer Mother 47    No Known Allergies  Medication list has been reviewed and updated.  Current Outpatient Prescriptions on File Prior to Visit  Medication Sig Dispense Refill  . Calcium Carbonate (CALCIUM 600 PO) Take 1 tablet by mouth at bedtime.     . cholecalciferol (VITAMIN D) 1000 UNITS tablet Take 1,000 Units by mouth daily as needed (during fall/winter.).     Marland Kitchen Coenzyme Q10 (CO Q 10) 10 MG CAPS Take 1 capsule by mouth every morning.    Marland Kitchen levothyroxine (SYNTHROID, LEVOTHROID) 100 MCG tablet Take 100 mcg by mouth daily before breakfast.    . Omega-3 Fatty Acids (FISH OIL PO) Take 1 tablet by mouth every morning.     . ferrous sulfate 325 (65 FE) MG tablet Take  1 tablet (325 mg total) by mouth 3 (three) times daily after meals. (Patient not taking: Reported on 07/10/2017)  3  . HYDROcodone-acetaminophen (NORCO) 7.5-325 MG per tablet Take 1-2 tablets by mouth every 4 (four) hours as needed for moderate pain. (Patient not taking: Reported on 07/10/2017) 100 tablet 0  . methocarbamol (ROBAXIN) 500 MG tablet Take 1 tablet (500 mg total) by mouth every 6 (six) hours as needed for muscle spasms. (Patient not taking: Reported on 07/10/2017) 50 tablet 0  . Multiple Vitamin (MULTIVITAMIN) capsule Take 1 capsule by mouth every morning.      No current facility-administered medications on file prior to visit.     ROS ROS otherwise unremarkable unless listed above.  Physical Examination: BP 132/82   Pulse 72   Temp 98.9 F (37.2 C) (Oral)   Resp 18   Ht 5' 4.57" (1.64 m)   Wt 143 lb 12.8 oz (65.2 kg)   SpO2 95%   BMI 24.25 kg/m  Ideal Body Weight: Weight in (lb)  to have BMI = 25: 147.9  Physical Exam  Constitutional: She is oriented to person, place, and time. She appears well-developed and well-nourished. No distress.  HENT:  Head: Normocephalic and atraumatic.  Right Ear: Tympanic membrane, external ear and ear canal normal.  Left Ear: Tympanic membrane, external ear and ear canal normal.  Nose: Mucosal edema (very minimal without pallor.  possibility of polyp at the left nostril) present. No rhinorrhea. Right sinus exhibits no maxillary sinus tenderness and no frontal sinus tenderness. Left sinus exhibits no maxillary sinus tenderness and no frontal sinus tenderness.  Mouth/Throat: No uvula swelling. No oropharyngeal exudate, posterior oropharyngeal edema or posterior oropharyngeal erythema.  AC>BC No lateralization.  Eyes: Pupils are equal, round, and reactive to light. Conjunctivae and EOM are normal.  Cardiovascular: Normal rate and regular rhythm.  Exam reveals no gallop, no distant heart sounds and no friction rub.   No murmur heard. Pulses:      Carotid pulses are 2+ on the right side, and 2+ on the left side. Pulmonary/Chest: Effort normal. No respiratory distress. She has no decreased breath sounds. She has no wheezes. She has no rhonchi.  Lymphadenopathy:       Head (right side): No preauricular and no posterior auricular adenopathy present.       Head (left side): No preauricular and no posterior auricular adenopathy present.  Neurological: She is alert and oriented to person, place, and time.  Skin: She is not diaphoretic.  Psychiatric: She has a normal mood and affect. Her behavior is normal.     Assessment and Plan: Tonya Dawson is a 65 y.o. female who is here today for hearing loss. Attempt short prednisone taper, and 2nd gen antihistamine, to attempt to rid of symptoms. If this is not well controlled, will follow up with ent in 3 weeks. Possibility that they will perform audiometry, and CT scan may be warranted.  Tinnitus  of both ears - Plan: predniSONE (DELTASONE) 20 MG tablet, fluticasone (FLONASE) 50 MCG/ACT nasal spray, cetirizine (ZYRTEC) 10 MG tablet  Bilateral hearing loss, unspecified hearing loss type - Plan: predniSONE (DELTASONE) 20 MG tablet, fluticasone (FLONASE) 50 MCG/ACT nasal spray, cetirizine (ZYRTEC) 10 MG tablet  Hearing loss of right ear, unspecified hearing loss type - Plan: predniSONE (DELTASONE) 20 MG tablet, fluticasone (FLONASE) 50 MCG/ACT nasal spray, cetirizine (ZYRTEC) 10 MG tablet  Nose congestion - Plan: fluticasone (FLONASE) 50 MCG/ACT nasal spray, cetirizine (ZYRTEC) 10 MG tablet  Ivar Drape,  PA-C [facility] 7/19/20189:37 AM

## 2017-09-25 DIAGNOSIS — H43393 Other vitreous opacities, bilateral: Secondary | ICD-10-CM | POA: Diagnosis not present

## 2018-02-27 DIAGNOSIS — N390 Urinary tract infection, site not specified: Secondary | ICD-10-CM | POA: Diagnosis not present

## 2018-02-27 DIAGNOSIS — E559 Vitamin D deficiency, unspecified: Secondary | ICD-10-CM | POA: Diagnosis not present

## 2018-02-27 DIAGNOSIS — E039 Hypothyroidism, unspecified: Secondary | ICD-10-CM | POA: Diagnosis not present

## 2018-03-06 DIAGNOSIS — Z23 Encounter for immunization: Secondary | ICD-10-CM | POA: Diagnosis not present

## 2018-03-06 DIAGNOSIS — Z0000 Encounter for general adult medical examination without abnormal findings: Secondary | ICD-10-CM | POA: Diagnosis not present

## 2018-03-06 DIAGNOSIS — Z1212 Encounter for screening for malignant neoplasm of rectum: Secondary | ICD-10-CM | POA: Diagnosis not present

## 2018-03-06 DIAGNOSIS — Z01419 Encounter for gynecological examination (general) (routine) without abnormal findings: Secondary | ICD-10-CM | POA: Diagnosis not present

## 2018-03-06 DIAGNOSIS — E559 Vitamin D deficiency, unspecified: Secondary | ICD-10-CM | POA: Diagnosis not present

## 2018-03-06 DIAGNOSIS — N951 Menopausal and female climacteric states: Secondary | ICD-10-CM | POA: Diagnosis not present

## 2018-03-06 DIAGNOSIS — E039 Hypothyroidism, unspecified: Secondary | ICD-10-CM | POA: Diagnosis not present

## 2018-03-06 DIAGNOSIS — Z800 Family history of malignant neoplasm of digestive organs: Secondary | ICD-10-CM | POA: Diagnosis not present

## 2018-03-26 ENCOUNTER — Encounter: Payer: Self-pay | Admitting: Physician Assistant

## 2018-03-26 DIAGNOSIS — R21 Rash and other nonspecific skin eruption: Secondary | ICD-10-CM | POA: Diagnosis not present

## 2018-03-26 DIAGNOSIS — E559 Vitamin D deficiency, unspecified: Secondary | ICD-10-CM | POA: Diagnosis not present

## 2018-03-26 DIAGNOSIS — E039 Hypothyroidism, unspecified: Secondary | ICD-10-CM | POA: Diagnosis not present

## 2018-03-26 DIAGNOSIS — N951 Menopausal and female climacteric states: Secondary | ICD-10-CM | POA: Diagnosis not present

## 2018-03-26 DIAGNOSIS — W57XXXA Bitten or stung by nonvenomous insect and other nonvenomous arthropods, initial encounter: Secondary | ICD-10-CM | POA: Diagnosis not present

## 2018-05-09 ENCOUNTER — Encounter: Payer: Self-pay | Admitting: Gastroenterology

## 2018-06-24 DIAGNOSIS — L0292 Furuncle, unspecified: Secondary | ICD-10-CM | POA: Diagnosis not present

## 2018-11-28 DIAGNOSIS — Z23 Encounter for immunization: Secondary | ICD-10-CM | POA: Diagnosis not present

## 2019-02-16 DIAGNOSIS — N390 Urinary tract infection, site not specified: Secondary | ICD-10-CM | POA: Diagnosis not present

## 2019-02-16 DIAGNOSIS — E039 Hypothyroidism, unspecified: Secondary | ICD-10-CM | POA: Diagnosis not present

## 2019-07-22 ENCOUNTER — Other Ambulatory Visit: Payer: Self-pay

## 2019-08-03 DIAGNOSIS — E559 Vitamin D deficiency, unspecified: Secondary | ICD-10-CM | POA: Diagnosis not present

## 2019-08-03 DIAGNOSIS — E039 Hypothyroidism, unspecified: Secondary | ICD-10-CM | POA: Diagnosis not present

## 2019-08-07 DIAGNOSIS — Z0000 Encounter for general adult medical examination without abnormal findings: Secondary | ICD-10-CM | POA: Diagnosis not present

## 2019-08-07 DIAGNOSIS — E039 Hypothyroidism, unspecified: Secondary | ICD-10-CM | POA: Diagnosis not present

## 2019-08-10 DIAGNOSIS — Z1212 Encounter for screening for malignant neoplasm of rectum: Secondary | ICD-10-CM | POA: Diagnosis not present

## 2019-08-10 DIAGNOSIS — E039 Hypothyroidism, unspecified: Secondary | ICD-10-CM | POA: Diagnosis not present

## 2019-08-10 DIAGNOSIS — R002 Palpitations: Secondary | ICD-10-CM | POA: Diagnosis not present

## 2019-08-10 DIAGNOSIS — Z0000 Encounter for general adult medical examination without abnormal findings: Secondary | ICD-10-CM | POA: Diagnosis not present

## 2019-08-10 DIAGNOSIS — E875 Hyperkalemia: Secondary | ICD-10-CM | POA: Diagnosis not present

## 2019-08-10 DIAGNOSIS — Z01419 Encounter for gynecological examination (general) (routine) without abnormal findings: Secondary | ICD-10-CM | POA: Diagnosis not present

## 2019-11-18 DIAGNOSIS — M2392 Unspecified internal derangement of left knee: Secondary | ICD-10-CM | POA: Diagnosis not present

## 2019-11-18 DIAGNOSIS — Z96641 Presence of right artificial hip joint: Secondary | ICD-10-CM | POA: Diagnosis not present

## 2019-11-18 DIAGNOSIS — M238X2 Other internal derangements of left knee: Secondary | ICD-10-CM | POA: Diagnosis not present

## 2019-11-18 DIAGNOSIS — M25562 Pain in left knee: Secondary | ICD-10-CM | POA: Diagnosis not present

## 2019-11-18 DIAGNOSIS — M1712 Unilateral primary osteoarthritis, left knee: Secondary | ICD-10-CM | POA: Diagnosis not present

## 2020-01-26 DIAGNOSIS — R079 Chest pain, unspecified: Secondary | ICD-10-CM | POA: Diagnosis not present

## 2020-01-26 DIAGNOSIS — K219 Gastro-esophageal reflux disease without esophagitis: Secondary | ICD-10-CM | POA: Diagnosis not present

## 2020-01-26 DIAGNOSIS — R002 Palpitations: Secondary | ICD-10-CM | POA: Diagnosis not present

## 2020-01-26 DIAGNOSIS — R0789 Other chest pain: Secondary | ICD-10-CM | POA: Diagnosis not present

## 2020-01-27 ENCOUNTER — Other Ambulatory Visit: Payer: Self-pay

## 2020-01-27 ENCOUNTER — Telehealth: Payer: Self-pay | Admitting: Internal Medicine

## 2020-01-27 ENCOUNTER — Encounter (INDEPENDENT_AMBULATORY_CARE_PROVIDER_SITE_OTHER): Payer: Self-pay

## 2020-01-27 ENCOUNTER — Ambulatory Visit (INDEPENDENT_AMBULATORY_CARE_PROVIDER_SITE_OTHER): Payer: Medicare Other | Admitting: Internal Medicine

## 2020-01-27 ENCOUNTER — Encounter: Payer: Self-pay | Admitting: Internal Medicine

## 2020-01-27 VITALS — BP 144/93 | HR 57 | Temp 97.2°F | Ht 65.5 in | Wt 146.0 lb

## 2020-01-27 DIAGNOSIS — R0789 Other chest pain: Secondary | ICD-10-CM

## 2020-01-27 DIAGNOSIS — K222 Esophageal obstruction: Secondary | ICD-10-CM | POA: Diagnosis not present

## 2020-01-27 DIAGNOSIS — K219 Gastro-esophageal reflux disease without esophagitis: Secondary | ICD-10-CM

## 2020-01-27 MED ORDER — PANTOPRAZOLE SODIUM 40 MG PO TBEC: 40.0000 mg | DELAYED_RELEASE_TABLET | Freq: Every day | ORAL | Status: DC

## 2020-01-27 NOTE — Telephone Encounter (Signed)
Patient aware OK to postpone test and she should call back if she wishes to r/s

## 2020-01-27 NOTE — Telephone Encounter (Signed)
GXT ordered per verbal from Dr. Debara Pickett Routed to scheduler Will need COVID screening set up prior

## 2020-01-27 NOTE — Patient Instructions (Addendum)
Testing/Procedures: Dr. Debara Pickett has ordered an exercise tolerance test (GXT) You would need a COVID screening test done prior to this stress test  Follow-Up: as needed-we will call with results of stress testing At [facility], you and your health needs are our priority.  As part of our continuing mission to provide you with exceptional heart care, we have created designated Provider Care Teams.  These Care Teams include your primary Cardiologist (physician) and Advanced Practice Providers (APPs -  Physician Assistants and Nurse Practitioners) who all work together to provide you with the care you need, when you need it.  Thank you for choosing [facility]!!

## 2020-01-27 NOTE — Telephone Encounter (Signed)
Per MD, exercise tolerance test (GXT) was the preferred stress test. Exercise Myoview ordered cancelled. Will re-order stress test is patient wants to schedule at a later time.

## 2020-01-27 NOTE — Telephone Encounter (Addendum)
Tonya Dawson decided not to do the Exercise Myoview when I told her she had to have a COVID test.  She said if Dr Debara Pickett decided there was an immediate need to have it done, she would.  Otherwise she is going to wait until summer to have it done

## 2020-01-27 NOTE — Telephone Encounter (Signed)
Routed to MD as FYI.

## 2020-01-27 NOTE — Telephone Encounter (Signed)
Patient called to schedule her MYOCARDIAL PERFUSION. However, her order was canceled I can't schedule it without the order, once order is place we can call patient to schedule.  Thank you.

## 2020-01-28 ENCOUNTER — Encounter: Payer: Self-pay | Admitting: Internal Medicine

## 2020-01-28 NOTE — Progress Notes (Signed)
OFFICE CONSULT NOTE  Chief Complaint:  Chest pain  Primary Care Physician: Deland Pretty, MD  HPI:  Tonya Dawson is a 69 y.o. female who is being seen today for the evaluation of chest pain at the request of Deland Pretty, MD.  This is a pleasant 69 year old female who recently was vacationing in the [place].  She said she had pain that started about 30 minutes after eating.  She thought she was having a heart attack.  It was midsternal and radiated somewhat up her neck.  There was a burning quality.  She went to sleep and in the morning she woke up and felt like some soreness in her chest.  She had taken some apple cider vinegar with water that did not help.  She said it felt worse when she took a deep breath.  She has been able to do most activities without any chest pain.  She has been able to walk up and down the hills and do work at altitude of about 5000 feet in [employer].  She reports that she has had a lot of gas recently.  She had some trouble with food that feels like it gets stuck in her esophagus.  Based on that her primary care provider placed her on a PPI which she is taken for a few days.  She has had no recurrent symptoms.  She was scheduled today for evaluation of possible angina.  She has few cardiac risk factors and is only on levothyroxine and recently started on pantoprazole.  No significant family history of early onset heart disease.  EKG today personally reviewed shows sinus bradycardia at 57 with incomplete right bundle branch block, poor R wave progression across the septum-personally reviewed.  PMHx:  Past Medical History:  Diagnosis Date  . Arthritis   . Family history of adverse reaction to anesthesia    pt states mother developed dementia from anesthesia at age 40  . Headache    hx of migraines last one 12 years ago   . Heart murmur   . Hypothyroidism     Past Surgical History:  Procedure Laterality Date  . TONSILLECTOMY    . TOTAL HIP ARTHROPLASTY  Right 06/28/2015   Procedure: RIGHT TOTAL HIP ARTHROPLASTY ANTERIOR APPROACH;  Surgeon: Paralee Cancel, MD;  Location: [facility];  Service: Orthopedics;  Laterality: Right;  . WISDOM TOOTH EXTRACTION  1973    FAMHx:  Family History  Problem Relation Age of Onset  . Colon cancer Mother 65    SOCHx:   reports that she has never smoked. She has never used smokeless tobacco. She reports current alcohol use of about 6.0 standard drinks of alcohol per week. She reports that she does not use drugs.  ALLERGIES:  No Known Allergies  ROS: Pertinent items noted in HPI and remainder of comprehensive ROS otherwise negative.  HOME MEDS: Current Outpatient Medications on File Prior to Visit  Medication Sig Dispense Refill  . cholecalciferol (VITAMIN D) 1000 UNITS tablet Take 1,000 Units by mouth daily as needed (during fall/winter.).     Marland Kitchen levothyroxine (SYNTHROID, LEVOTHROID) 100 MCG tablet Take 100 mcg by mouth daily before breakfast.    . Multiple Vitamin (MULTIVITAMIN) capsule Take 1 capsule by mouth every morning.     . Omega-3 Fatty Acids (FISH OIL PO) Take 1 tablet by mouth every morning.     . ferrous sulfate 325 (65 FE) MG tablet Take 1 tablet (325 mg total) by mouth 3 (three) times daily  after meals. (Patient not taking: Reported on 01/27/2020)  3   No current facility-administered medications on file prior to visit.    LABS/IMAGING: No results found for this or any previous visit (from the past 48 hour(s)). No results found.  LIPID PANEL: No results found for: CHOL, TRIG, HDL, CHOLHDL, VLDL, LDLCALC, LDLDIRECT  WEIGHTS: Wt Readings from Last 3 Encounters:  01/27/20 146 lb (66.2 kg)  07/10/17 143 lb 12.8 oz (65.2 kg)  06/28/15 145 lb 2 oz (65.8 kg)    VITALS: BP (!) 144/93   Pulse (!) 57   Temp (!) 97.2 F (36.2 C)   Ht 5' 5.5" (1.664 m)   Wt 146 lb (66.2 kg)   SpO2 99%   BMI 23.93 kg/m   EXAM: General appearance: alert and no distress Neck: no carotid bruit, no JVD and  thyroid not enlarged, symmetric, no tenderness/mass/nodules Lungs: clear to auscultation bilaterally Heart: regular rate and rhythm, S1, S2 normal, no murmur, click, rub or gallop Abdomen: soft, non-tender; bowel sounds normal; no masses,  no organomegaly Extremities: extremities normal, atraumatic, no cyanosis or edema Pulses: 2+ and symmetric Skin: Skin color, texture, turgor normal. No rashes or lesions Neurologic: Grossly normal Psych: Pleasant  EKG: Sinus bradycardia 57, incomplete right bundle branch block, poor R wave progression anteriorly- personally reviewed  ASSESSMENT: 1. Atypical chest pain 2. Suspect GERD/esophageal spasm  PLAN: 1.   [name] has atypical chest pain however her EKG is a little abnormal.  This episode only occurred after eating and is not seen with exertion or relieved by rest.  Although I think it is atypical, with her EKG changes, I would favor at maximum treadmill exercise stress test.  She seemed agreeable to this and we will plan that in the near future.  I advised continued treatment with PPI for least 2 to 3 weeks if she persists to have dysphagia, she may need a GI evaluation.  Thanks again for the kind referral.  Pixie Casino, MD, FACC, [place] Director of the [facility] Diplomate of the American Board of Clinical Lipidology Attending Cardiologist  Direct Dial: [phone]  Fax: [phone]  Website:  [email or web address] 01/28/2020, 4:42 PM

## 2020-02-02 NOTE — Addendum Note (Signed)
Addended by: Merri Ray A on: 02/02/2020 04:38 PM   Modules accepted: Orders

## 2020-02-03 ENCOUNTER — Encounter (HOSPITAL_COMMUNITY): Payer: BLUE CROSS/BLUE SHIELD

## 2020-02-10 DIAGNOSIS — H2513 Age-related nuclear cataract, bilateral: Secondary | ICD-10-CM | POA: Diagnosis not present

## 2020-03-21 ENCOUNTER — Telehealth (HOSPITAL_COMMUNITY): Payer: Self-pay | Admitting: Internal Medicine

## 2020-03-21 NOTE — Telephone Encounter (Signed)
I called patient to schedule GXT that she asked to wait to schedule in February.  She states that she is feeling fine and no having any issues and would like to cancel having this test. She will call us back if she has any issues in the future.  Order will be cancelled from the [facility].

## 2020-08-15 DIAGNOSIS — E039 Hypothyroidism, unspecified: Secondary | ICD-10-CM | POA: Diagnosis not present

## 2020-08-15 DIAGNOSIS — E559 Vitamin D deficiency, unspecified: Secondary | ICD-10-CM | POA: Diagnosis not present

## 2020-08-16 DIAGNOSIS — B354 Tinea corporis: Secondary | ICD-10-CM | POA: Diagnosis not present

## 2020-08-16 DIAGNOSIS — Z800 Family history of malignant neoplasm of digestive organs: Secondary | ICD-10-CM | POA: Diagnosis not present

## 2020-08-16 DIAGNOSIS — E559 Vitamin D deficiency, unspecified: Secondary | ICD-10-CM | POA: Diagnosis not present

## 2020-08-16 DIAGNOSIS — E039 Hypothyroidism, unspecified: Secondary | ICD-10-CM | POA: Diagnosis not present

## 2020-08-16 DIAGNOSIS — Z0000 Encounter for general adult medical examination without abnormal findings: Secondary | ICD-10-CM | POA: Diagnosis not present

## 2020-09-12 DIAGNOSIS — Z1152 Encounter for screening for COVID-19: Secondary | ICD-10-CM | POA: Diagnosis not present

## 2020-09-12 DIAGNOSIS — Z20822 Contact with and (suspected) exposure to covid-19: Secondary | ICD-10-CM | POA: Diagnosis not present

## 2020-09-14 ENCOUNTER — Encounter: Payer: Self-pay | Admitting: Internal Medicine

## 2020-10-04 DIAGNOSIS — Z23 Encounter for immunization: Secondary | ICD-10-CM | POA: Diagnosis not present

## 2020-10-06 DIAGNOSIS — Z5321 Procedure and treatment not carried out due to patient leaving prior to being seen by health care provider: Secondary | ICD-10-CM | POA: Diagnosis not present

## 2020-11-03 DIAGNOSIS — L814 Other melanin hyperpigmentation: Secondary | ICD-10-CM | POA: Diagnosis not present

## 2020-11-03 DIAGNOSIS — D1801 Hemangioma of skin and subcutaneous tissue: Secondary | ICD-10-CM | POA: Diagnosis not present

## 2020-11-03 DIAGNOSIS — L821 Other seborrheic keratosis: Secondary | ICD-10-CM | POA: Diagnosis not present

## 2020-12-14 DIAGNOSIS — Z20822 Contact with and (suspected) exposure to covid-19: Secondary | ICD-10-CM | POA: Diagnosis not present

## 2021-01-10 DIAGNOSIS — Z1152 Encounter for screening for COVID-19: Secondary | ICD-10-CM | POA: Diagnosis not present

## 2021-04-03 DIAGNOSIS — Z23 Encounter for immunization: Secondary | ICD-10-CM | POA: Diagnosis not present

## 2021-09-26 DIAGNOSIS — Z23 Encounter for immunization: Secondary | ICD-10-CM | POA: Diagnosis not present

## 2021-09-27 DIAGNOSIS — E559 Vitamin D deficiency, unspecified: Secondary | ICD-10-CM | POA: Diagnosis not present

## 2021-09-27 DIAGNOSIS — E039 Hypothyroidism, unspecified: Secondary | ICD-10-CM | POA: Diagnosis not present

## 2021-09-27 DIAGNOSIS — Z0000 Encounter for general adult medical examination without abnormal findings: Secondary | ICD-10-CM | POA: Diagnosis not present

## 2021-10-03 DIAGNOSIS — N951 Menopausal and female climacteric states: Secondary | ICD-10-CM | POA: Diagnosis not present

## 2021-10-03 DIAGNOSIS — Z800 Family history of malignant neoplasm of digestive organs: Secondary | ICD-10-CM | POA: Diagnosis not present

## 2021-10-03 DIAGNOSIS — E039 Hypothyroidism, unspecified: Secondary | ICD-10-CM | POA: Diagnosis not present

## 2021-10-03 DIAGNOSIS — Z0000 Encounter for general adult medical examination without abnormal findings: Secondary | ICD-10-CM | POA: Diagnosis not present

## 2022-01-29 ENCOUNTER — Encounter: Payer: Self-pay | Admitting: Registered Nurse

## 2022-02-26 DIAGNOSIS — H43812 Vitreous degeneration, left eye: Secondary | ICD-10-CM | POA: Diagnosis not present

## 2022-05-09 DIAGNOSIS — M25562 Pain in left knee: Secondary | ICD-10-CM | POA: Diagnosis not present

## 2022-05-09 DIAGNOSIS — M1712 Unilateral primary osteoarthritis, left knee: Secondary | ICD-10-CM | POA: Diagnosis not present

## 2022-06-07 DIAGNOSIS — M25562 Pain in left knee: Secondary | ICD-10-CM | POA: Diagnosis not present

## 2022-07-04 DIAGNOSIS — M1712 Unilateral primary osteoarthritis, left knee: Secondary | ICD-10-CM | POA: Diagnosis not present

## 2022-08-13 ENCOUNTER — Encounter: Payer: Self-pay | Admitting: Gastroenterology

## 2022-09-13 ENCOUNTER — Ambulatory Visit (AMBULATORY_SURGERY_CENTER): Payer: Medicare Other

## 2022-09-13 VITALS — Ht 65.0 in | Wt 145.0 lb

## 2022-09-13 DIAGNOSIS — Z800 Family history of malignant neoplasm of digestive organs: Secondary | ICD-10-CM

## 2022-09-13 MED ORDER — NA SULFATE-K SULFATE-MG SULF 17.5-3.13-1.6 GM/177ML PO SOLN: 1.0000 | Freq: Once | ORAL | 0 refills | Status: AC

## 2022-09-13 NOTE — Progress Notes (Signed)
No egg or soy allergy known to patient  No issues known to pt with past sedation with any surgeries or procedures Patient denies ever being told they had issues or difficulty with intubation  No FH of Malignant Hyperthermia Pt is not on diet pills Pt is not on  home 02  Pt is not on blood thinners  Pt denies issues with constipation  No A fib or A flutter Have any cardiac testing pending--no Pt instructed to use [email or web address] or GoodRx for a price reduction on prep   

## 2022-10-09 DIAGNOSIS — Z0000 Encounter for general adult medical examination without abnormal findings: Secondary | ICD-10-CM | POA: Diagnosis not present

## 2022-10-09 DIAGNOSIS — I10 Essential (primary) hypertension: Secondary | ICD-10-CM | POA: Diagnosis not present

## 2022-10-09 DIAGNOSIS — E039 Hypothyroidism, unspecified: Secondary | ICD-10-CM | POA: Diagnosis not present

## 2022-10-09 DIAGNOSIS — E559 Vitamin D deficiency, unspecified: Secondary | ICD-10-CM | POA: Diagnosis not present

## 2022-10-10 ENCOUNTER — Telehealth: Payer: Self-pay | Admitting: Gastroenterology

## 2022-10-10 NOTE — Telephone Encounter (Signed)
Inbound call from patient states she just tested positive for covid, she has procedure on 10/26. Doesn't know if she should reschedule, please advise.

## 2022-10-11 ENCOUNTER — Encounter: Payer: Self-pay | Admitting: *Deleted

## 2022-10-11 NOTE — Telephone Encounter (Signed)
Called patient regarding positive Covid test.  Per our policy, patient will need to cancel appt on 10/18/22 and reschedule after 10/22/22.  Patient acknowledged and will wait for scheduler to call and reschedule.

## 2022-10-12 NOTE — Telephone Encounter (Signed)
error 

## 2022-10-16 ENCOUNTER — Encounter: Payer: Medicare Other | Admitting: Gastroenterology

## 2022-10-16 NOTE — Telephone Encounter (Signed)
Colonoscopy rescheduled to 11-07-22.

## 2022-10-18 ENCOUNTER — Encounter: Payer: Medicare Other | Admitting: Gastroenterology

## 2022-11-07 ENCOUNTER — Encounter: Payer: Medicare Other | Admitting: Gastroenterology

## 2022-11-07 ENCOUNTER — Ambulatory Visit (AMBULATORY_SURGERY_CENTER): Payer: Medicare Other | Admitting: Gastroenterology

## 2022-11-07 ENCOUNTER — Encounter: Payer: Self-pay | Admitting: Gastroenterology

## 2022-11-07 VITALS — BP 110/76 | HR 68 | Temp 97.1°F | Resp 11 | Ht 65.0 in | Wt 145.0 lb

## 2022-11-07 DIAGNOSIS — D120 Benign neoplasm of cecum: Secondary | ICD-10-CM | POA: Diagnosis not present

## 2022-11-07 DIAGNOSIS — Z1211 Encounter for screening for malignant neoplasm of colon: Secondary | ICD-10-CM | POA: Diagnosis not present

## 2022-11-07 DIAGNOSIS — D122 Benign neoplasm of ascending colon: Secondary | ICD-10-CM

## 2022-11-07 DIAGNOSIS — Z800 Family history of malignant neoplasm of digestive organs: Secondary | ICD-10-CM

## 2022-11-07 DIAGNOSIS — D123 Benign neoplasm of transverse colon: Secondary | ICD-10-CM | POA: Diagnosis not present

## 2022-11-07 DIAGNOSIS — E039 Hypothyroidism, unspecified: Secondary | ICD-10-CM | POA: Diagnosis not present

## 2022-11-07 MED ORDER — SODIUM CHLORIDE 0.9 % IV SOLN: 500.0000 mL | Freq: Once | INTRAVENOUS | Status: DC

## 2022-11-07 NOTE — Op Note (Signed)
[facility] Patient Name: Donalyn Schneeberger Procedure Date: 11/07/2022 8:39 AM MRN: [id number] Endoscopist: [facility]. Candis Schatz , MD, [id number] Age: 71 Referring MD:  Date of Birth: 03/07/1951 Gender: Female Account #: [id number] Procedure:                Colonoscopy Indications:              Screening in patient at increased risk: Colorectal                            cancer in mother 56 or older Medicines:                Monitored Anesthesia Care Procedure:                Pre-Anesthesia Assessment:                           - Prior to the procedure, a History and Physical                            was performed, and patient medications and                            allergies were reviewed. The patient's tolerance of                            previous anesthesia was also reviewed. The risks                            and benefits of the procedure and the sedation                            options and risks were discussed with the patient.                            All questions were answered, and informed consent                            was obtained. Prior Anticoagulants: The patient has                            taken no anticoagulant or antiplatelet agents. ASA                            Grade Assessment: II - A patient with mild systemic                            disease. After reviewing the risks and benefits,                            the patient was deemed in satisfactory condition to                            undergo the procedure.  After obtaining informed consent, the colonoscope                            was passed under direct vision. Throughout the                            procedure, the patient's blood pressure, pulse, and                            oxygen saturations were monitored continuously. The                            Olympus CF-HQ190L (Serial# [id number]) Colonoscope was                            introduced through the  anus and advanced to the the                            terminal ileum, with identification of the                            appendiceal orifice and IC valve. The colonoscopy                            was performed without difficulty. The patient                            tolerated the procedure well. The quality of the                            bowel preparation was excellent. The terminal                            ileum, ileocecal valve, appendiceal orifice, and                            rectum were photographed. The bowel preparation                            used was SUPREP via split dose instruction. Scope In: 8:50:10 AM Scope Out: 9:09:44 AM Scope Withdrawal Time: 0 hours 13 minutes 51 seconds  Total Procedure Duration: 0 hours 19 minutes 34 seconds  Findings:                 The perianal and digital rectal examinations were                            normal. Pertinent negatives include normal                            sphincter tone and no palpable rectal lesions.                           A 4 mm polyp was found in the cecum. The polyp  was                            flat. The polyp was removed with a cold snare.                            Resection and retrieval were complete. Estimated                            blood loss was minimal.                           A 3 mm polyp was found in the ascending colon. The                            polyp was sessile. The polyp was removed with a                            cold snare. Resection and retrieval were complete.                            Estimated blood loss was minimal.                           A 4 mm polyp was found in the splenic flexure. The                            polyp was sessile. The polyp was removed with a                            cold snare. Resection and retrieval were complete.                            Estimated blood loss was minimal.                           The exam was otherwise normal throughout  the                            examined colon.                           The terminal ileum appeared normal.                           The retroflexed view of the distal rectum and anal                            verge was normal and showed no anal or rectal                            abnormalities. Complications:            No immediate complications. Estimated Blood Loss:     Estimated blood loss was minimal. Impression:               -  One 4 mm polyp in the cecum, removed with a cold                            snare. Resected and retrieved.                           - One 3 mm polyp in the ascending colon, removed                            with a cold snare. Resected and retrieved.                           - One 4 mm polyp at the splenic flexure, removed                            with a cold snare. Resected and retrieved.                           - The examined portion of the ileum was normal.                           - The distal rectum and anal verge are normal on                            retroflexion view. Recommendation:           - Patient has a contact number available for                            emergencies. The signs and symptoms of potential                            delayed complications were discussed with the                            patient. Return to normal activities tomorrow.                            Written discharge instructions were provided to the                            patient.                           - Resume previous diet.                           - Continue present medications.                           - Await pathology results.                           - Repeat colonoscopy (date not yet determined) for  surveillance based on pathology results. Shellby Schlink E. Candis Schatz, MD 11/07/2022 9:16:44 AM This report has been signed electronically.

## 2022-11-07 NOTE — Progress Notes (Signed)
[facility] History and Physical   Primary Care Physician:  Deland Pretty, MD   Reason for Procedure:   Colon cancer screening  Plan:    Screening colonoscopy     HPI: Tonya Dawson is a 71 y.o. female undergoing screening colonoscopy.  She has no chronic GI symptoms.  Her mother was diagnosed with colon cancer at age 71.  She had a normal colonoscopy in 2014   Past Medical History:  Diagnosis Date   Arthritis    Family history of adverse reaction to anesthesia    pt states mother developed dementia from anesthesia at age 60   Headache    hx of migraines last one 12 years ago    Heart murmur    Hypothyroidism     Past Surgical History:  Procedure Laterality Date   COLONOSCOPY  04/03/2013   [facility]   TONSILLECTOMY     TOTAL HIP ARTHROPLASTY Right 06/28/2015   Procedure: RIGHT TOTAL HIP ARTHROPLASTY ANTERIOR APPROACH;  Surgeon: Paralee Cancel, MD;  Location: [facility];  Service: Orthopedics;  Laterality: Right;   WISDOM TOOTH EXTRACTION  12/25/1971    Prior to Admission medications   Medication Sig Start Date End Date Taking? Authorizing Provider  CALCIUM PO Take by mouth daily.   Yes [provider]  cholecalciferol (VITAMIN D) 1000 UNITS tablet Take 1,000 Units by mouth daily as needed (during fall/winter.).    Yes [provider]  levothyroxine (SYNTHROID, LEVOTHROID) 100 MCG tablet Take 100 mcg by mouth daily before breakfast.   Yes [provider]  MAGNESIUM PO Take by mouth.   Yes [provider]  Multiple Vitamin (MULTIVITAMIN) capsule Take 1 capsule by mouth every morning.    Yes [provider]  Omega-3 Fatty Acids (FISH OIL PO) Take 1 tablet by mouth every morning.    Yes [provider]    Current Outpatient Medications  Medication Sig Dispense Refill   CALCIUM PO Take by mouth daily.     cholecalciferol (VITAMIN D) 1000 UNITS tablet Take 1,000 Units by mouth daily as needed (during fall/winter.).       levothyroxine (SYNTHROID, LEVOTHROID) 100 MCG tablet Take 100 mcg by mouth daily before breakfast.     MAGNESIUM PO Take by mouth.     Multiple Vitamin (MULTIVITAMIN) capsule Take 1 capsule by mouth every morning.      Omega-3 Fatty Acids (FISH OIL PO) Take 1 tablet by mouth every morning.      Current Facility-Administered Medications  Medication Dose Route Frequency Provider Last Rate Last Admin   0.9 %  sodium chloride infusion  500 mL Intravenous Once Dustin Flock E, MD       0.9 %  sodium chloride infusion  500 mL Intravenous Once Daryel November, MD        Allergies as of 11/07/2022   (No Known Allergies)    Family History  Problem Relation Age of Onset   Colon cancer Mother 46   Colon polyps Brother    Esophageal cancer Neg Hx    Stomach cancer Neg Hx    Rectal cancer Neg Hx     Social History   Socioeconomic History   Marital status: Married    Spouse name: Not on file   Number of children: Not on file   Years of education: Not on file   Highest education level: Not on file  Occupational History   Not on file  Tobacco Use   Smoking status: Never  Smokeless tobacco: Never  Substance and Sexual Activity   Alcohol use: Yes    Alcohol/week: 4.0 standard drinks of alcohol    Types: 4 Glasses of wine per week    Comment: socially    Drug use: No   Sexual activity: Not on file  Other Topics Concern   Not on file  Social History Narrative   Not on file   Social Determinants of Health   Financial Resource Strain: Not on file  Food Insecurity: Not on file  Transportation Needs: Not on file  Physical Activity: Not on file  Stress: Not on file  Social Connections: Not on file  Intimate Partner Violence: Not on file    Review of Systems:  All other review of systems negative except as mentioned in the HPI.  Physical Exam: Vital signs BP (!) 143/96   Pulse 84   Temp (!) 97.1 F (36.2 C)   Ht '5\' 5"'$  (1.651 m)   Wt 145 lb (65.8 kg)   SpO2  99%   BMI 24.13 kg/m   General:   Alert,  Well-developed, well-nourished, pleasant and cooperative in NAD Airway:  Mallampati 2 Lungs:  Clear throughout to auscultation.   Heart:  Regular rate and rhythm; no murmurs, clicks, rubs,  or gallops. Abdomen:  Soft, nontender and nondistended. Normal bowel sounds.   Neuro/Psych:  Normal mood and affect. A and O x 3   Tanmay Halteman E. Candis Schatz, MD [facility]

## 2022-11-07 NOTE — Patient Instructions (Signed)
Read all of the handouts given to you by your recovery room nurse.  Resume all of your medications as ordered.  YOU HAD AN ENDOSCOPIC PROCEDURE TODAY AT [facility]:   Refer to the procedure report that was given to you for any specific questions about what was found during the examination.  If the procedure report does not answer your questions, please call your gastroenterologist to clarify.  If you requested that your care partner not be given the details of your procedure findings, then the procedure report has been included in a sealed envelope for you to review at your convenience later.  YOU SHOULD EXPECT: Some feelings of bloating in the abdomen. Passage of more gas than usual.  Walking can help get rid of the air that was put into your GI tract during the procedure and reduce the bloating. If you had a lower endoscopy (such as a colonoscopy or flexible sigmoidoscopy) you may notice spotting of blood in your stool or on the toilet paper. If you underwent a bowel prep for your procedure, you may not have a normal bowel movement for a few days.  Please Note:  You might notice some irritation and congestion in your nose or some drainage.  This is from the oxygen used during your procedure.  There is no need for concern and it should clear up in a day or so.  SYMPTOMS TO REPORT IMMEDIATELY:  Following lower endoscopy (colonoscopy or flexible sigmoidoscopy):  Excessive amounts of blood in the stool  Significant tenderness or worsening of abdominal pains  Swelling of the abdomen that is new, acute  Fever of 100F or higher   For urgent or emergent issues, a gastroenterologist can be reached at any hour by calling [phone]. Do not use MyChart messaging for urgent concerns.    DIET:  We do recommend a small meal at first, but then you may proceed to your regular diet.  Drink plenty of fluids but you should avoid alcoholic beverages for 24 hours.  ACTIVITY:  You should  plan to take it easy for the rest of today and you should NOT DRIVE or use heavy machinery until tomorrow (because of the sedation medicines used during the test).    FOLLOW UP: Our staff will call the number listed on your records the next business day following your procedure.  We will call around 7:15- 8:00 am to check on you and address any questions or concerns that you may have regarding the information given to you following your procedure. If we do not reach you, we will leave a message.     If any biopsies were taken you will be contacted by phone or by letter within the next 1-3 weeks.  Please call us at [phone] if you have not heard about the biopsies in 3 weeks.    SIGNATURES/CONFIDENTIALITY: You and/or your care partner have signed paperwork which will be entered into your electronic medical record.  These signatures attest to the fact that that the information above on your After Visit Summary has been reviewed and is understood.  Full responsibility of the confidentiality of this discharge information lies with you and/or your care-partner.

## 2022-11-07 NOTE — Progress Notes (Signed)
Vss nad trans to pacu °

## 2022-11-07 NOTE — Progress Notes (Signed)
Called to room to assist during endoscopic procedure.  Patient ID and intended procedure confirmed with present staff. Received instructions for my participation in the procedure from the performing physician.  

## 2022-11-07 NOTE — Progress Notes (Signed)
Pt's states no medical or surgical changes since previsit or office visit. 

## 2022-11-08 ENCOUNTER — Telehealth: Payer: Self-pay

## 2022-11-08 NOTE — Telephone Encounter (Signed)
  Follow up Call-     11/07/2022    8:15 AM  Call back number  Post procedure Call Back phone  # [phone]  Permission to leave phone message Yes     Patient questions:  Do you have a fever, pain , or abdominal swelling? No. Pain Score  0 *  Have you tolerated food without any problems? Yes.    Have you been able to return to your normal activities? Yes.    Do you have any questions about your discharge instructions: Diet   No. Medications  No. Follow up visit  No.  Do you have questions or concerns about your Care? No.  Actions: * If pain score is 4 or above: No action needed, pain <4.

## 2022-11-13 ENCOUNTER — Encounter: Payer: Self-pay | Admitting: Gastroenterology

## 2022-11-29 DIAGNOSIS — Z23 Encounter for immunization: Secondary | ICD-10-CM | POA: Diagnosis not present

## 2022-11-29 DIAGNOSIS — E039 Hypothyroidism, unspecified: Secondary | ICD-10-CM | POA: Diagnosis not present

## 2022-11-29 DIAGNOSIS — Z0000 Encounter for general adult medical examination without abnormal findings: Secondary | ICD-10-CM | POA: Diagnosis not present

## 2022-11-29 DIAGNOSIS — E559 Vitamin D deficiency, unspecified: Secondary | ICD-10-CM | POA: Diagnosis not present

## 2022-12-28 DIAGNOSIS — M1712 Unilateral primary osteoarthritis, left knee: Secondary | ICD-10-CM | POA: Diagnosis not present

## 2023-01-09 DIAGNOSIS — Z23 Encounter for immunization: Secondary | ICD-10-CM | POA: Diagnosis not present

## 2023-04-02 DIAGNOSIS — H43391 Other vitreous opacities, right eye: Secondary | ICD-10-CM | POA: Diagnosis not present

## 2023-04-03 DIAGNOSIS — M1712 Unilateral primary osteoarthritis, left knee: Secondary | ICD-10-CM | POA: Diagnosis not present

## 2023-05-02 DIAGNOSIS — H35371 Puckering of macula, right eye: Secondary | ICD-10-CM | POA: Diagnosis not present

## 2023-06-04 DIAGNOSIS — Z01818 Encounter for other preprocedural examination: Secondary | ICD-10-CM | POA: Diagnosis not present

## 2023-06-14 DIAGNOSIS — M1712 Unilateral primary osteoarthritis, left knee: Secondary | ICD-10-CM | POA: Diagnosis not present

## 2023-06-14 DIAGNOSIS — M25562 Pain in left knee: Secondary | ICD-10-CM | POA: Diagnosis not present

## 2023-07-02 NOTE — Patient Instructions (Signed)
DUE TO COVID-19 ONLY TWO VISITORS  (aged 72 and older)  ARE ALLOWED TO COME WITH YOU AND STAY IN THE WAITING ROOM ONLY DURING PRE OP AND PROCEDURE.   **NO VISITORS ARE ALLOWED IN THE SHORT STAY AREA OR RECOVERY ROOM!!**  IF YOU WILL BE ADMITTED INTO THE HOSPITAL YOU ARE ALLOWED ONLY FOUR SUPPORT PEOPLE DURING VISITATION HOURS ONLY (7 AM -8PM)   The support person(s) must pass our screening, gel in and out, and wear a mask at all times, including in the patient's room. Patients must also wear a mask when staff or their support person are in the room. Visitors GUEST BADGE MUST BE WORN VISIBLY  One adult visitor may remain with you overnight and MUST be in the room by 8 P.M.     Your procedure is scheduled on: 07/09/23   Report to [facility] Main Entrance    Report to admitting at : 5:15 AM   Call this number if you have problems the morning of surgery [phone]   Do not eat food :After Midnight.   After Midnight you may have the following liquids until : 4:00 AM DAY OF SURGERY  Water Black Coffee (sugar ok, NO MILK/CREAM OR CREAMERS)  Tea (sugar ok, NO MILK/CREAM OR CREAMERS) regular and decaf                             Plain Jell-O (NO RED)                                           Fruit ices (not with fruit pulp, NO RED)                                     Popsicles (NO RED)                                                                  Juice: apple, WHITE grape, WHITE cranberry Sports drinks like Gatorade (NO RED)   The day of surgery:  Drink ONE (1) Pre-Surgery Clear Ensure at : 4:00 AM the morning of surgery. Drink in one sitting. Do not sip.  This drink was given to you during your hospital  pre-op appointment visit. Nothing else to drink after completing the  Pre-Surgery Clear Ensure or G2.          If you have questions, please contact your surgeon's office.   Oral Hygiene is also important to reduce your risk of infection.                                     Remember - BRUSH YOUR TEETH THE MORNING OF SURGERY WITH YOUR REGULAR TOOTHPASTE  DENTURES WILL BE REMOVED PRIOR TO SURGERY PLEASE DO NOT APPLY "Poly grip" OR ADHESIVES!!!   Do NOT smoke after Midnight   Take these medicines the morning of surgery with A SIP OF WATER: levothyroxine  You may not have any metal on your body including hair pins, jewelry, and body piercing             Do not wear make-up, lotions, powders, perfumes/cologne, or deodorant  Do not wear nail polish including gel and S&S, artificial/acrylic nails, or any other type of covering on natural nails including finger and toenails. If you have artificial nails, gel coating, etc. that needs to be removed by a nail salon please have this removed prior to surgery or surgery may need to be canceled/ delayed if the surgeon/ anesthesia feels like they are unable to be safely monitored.   Do not shave  48 hours prior to surgery.    Do not bring valuables to the hospital. [facility] IS NOT             RESPONSIBLE   FOR VALUABLES.   Contacts, glasses, or bridgework may not be worn into surgery.   Bring small overnight bag day of surgery.   DO NOT BRING YOUR HOME MEDICATIONS TO THE HOSPITAL. PHARMACY WILL DISPENSE MEDICATIONS LISTED ON YOUR MEDICATION LIST TO YOU DURING YOUR ADMISSION IN THE HOSPITAL!    Patients discharged on the day of surgery will not be allowed to drive home.  Someone NEEDS to stay with you for the first 24 hours after anesthesia.   Special Instructions: Bring a copy of your healthcare power of attorney and living will documents         the day of surgery if you haven't scanned them before.              Please read over the following fact sheets you were given: IF YOU HAVE QUESTIONS ABOUT YOUR PRE-OP INSTRUCTIONS PLEASE CALL [phone]      Pre-operative 5 CHG Bath Instructions   You can play a key role in reducing the risk of infection after surgery. Your skin needs to  be as free of germs as possible. You can reduce the number of germs on your skin by washing with CHG (chlorhexidine gluconate) soap before surgery. CHG is an antiseptic soap that kills germs and continues to kill germs even after washing.   DO NOT use if you have an allergy to chlorhexidine/CHG or antibacterial soaps. If your skin becomes reddened or irritated, stop using the CHG and notify one of our RNs at : [phone].   Please shower with the CHG soap starting 4 days before surgery using the following schedule:     Please keep in mind the following:  DO NOT shave, including legs and underarms, starting the day of your first shower.   You may shave your face at any point before/day of surgery.  Place clean sheets on your bed the day you start using CHG soap. Use a clean washcloth (not used since being washed) for each shower. DO NOT sleep with pets once you start using the CHG.   CHG Shower Instructions:  If you choose to wash your hair and private area, wash first with your normal shampoo/soap.  After you use shampoo/soap, rinse your hair and body thoroughly to remove shampoo/soap residue.  Turn the water OFF and apply about 3 tablespoons (45 ml) of CHG soap to a CLEAN washcloth.  Apply CHG soap ONLY FROM YOUR NECK DOWN TO YOUR TOES (washing for 3-5 minutes)  DO NOT use CHG soap on face, private areas, open wounds, or sores.  Pay special attention to the area where your surgery is being performed.  If you are having back surgery, having someone wash your back for you may be helpful. Wait 2 minutes after CHG soap is applied, then you may rinse off the CHG soap.  Pat dry with a clean towel  Put on clean clothes/pajamas   If you choose to wear lotion, please use ONLY the CHG-compatible lotions on the back of this paper.     Additional instructions for the day of surgery: DO NOT APPLY any lotions, deodorants, cologne, or perfumes.   Put on clean/comfortable clothes.  Brush your  teeth.  Ask your nurse before applying any prescription medications to the skin.      CHG Compatible Lotions   Aveeno Moisturizing lotion  Cetaphil Moisturizing Cream  Cetaphil Moisturizing Lotion  Clairol Herbal Essence Moisturizing Lotion, Dry Skin  Clairol Herbal Essence Moisturizing Lotion, Extra Dry Skin  Clairol Herbal Essence Moisturizing Lotion, Normal Skin  Curel Age Defying Therapeutic Moisturizing Lotion with Alpha Hydroxy  Curel Extreme Care Body Lotion  Curel Soothing Hands Moisturizing Hand Lotion  Curel Therapeutic Moisturizing Cream, Fragrance-Free  Curel Therapeutic Moisturizing Lotion, Fragrance-Free  Curel Therapeutic Moisturizing Lotion, Original Formula  Eucerin Daily Replenishing Lotion  Eucerin Dry Skin Therapy Plus Alpha Hydroxy Crme  Eucerin Dry Skin Therapy Plus Alpha Hydroxy Lotion  Eucerin Original Crme  Eucerin Original Lotion  Eucerin Plus Crme Eucerin Plus Lotion  Eucerin TriLipid Replenishing Lotion  Keri Anti-Bacterial Hand Lotion  Keri Deep Conditioning Original Lotion Dry Skin Formula Softly Scented  Keri Deep Conditioning Original Lotion, Fragrance Free Sensitive Skin Formula  Keri Lotion Fast Absorbing Fragrance Free Sensitive Skin Formula  Keri Lotion Fast Absorbing Softly Scented Dry Skin Formula  Keri Original Lotion  Keri Skin Renewal Lotion Keri Silky Smooth Lotion  Keri Silky Smooth Sensitive Skin Lotion  Nivea Body Creamy Conditioning Oil  Nivea Body Extra Enriched Lotion  Nivea Body Original Lotion  Nivea Body Sheer Moisturizing Lotion Nivea Crme  Nivea Skin Firming Lotion  NutraDerm 30 Skin Lotion  NutraDerm Skin Lotion  NutraDerm Therapeutic Skin Cream  NutraDerm Therapeutic Skin Lotion  ProShield Protective Hand Cream  Provon moisturizing lotion   Incentive Spirometer  An incentive spirometer is a tool that can help keep your lungs clear and active. This tool measures how well you are filling your lungs with each  breath. Taking long deep breaths may help reverse or decrease the chance of developing breathing (pulmonary) problems (especially infection) following: A long period of time when you are unable to move or be active. BEFORE THE PROCEDURE  If the spirometer includes an indicator to show your best effort, your nurse or respiratory therapist will set it to a desired goal. If possible, sit up straight or lean slightly forward. Try not to slouch. Hold the incentive spirometer in an upright position. INSTRUCTIONS FOR USE  Sit on the edge of your bed if possible, or sit up as far as you can in bed or on a chair. Hold the incentive spirometer in an upright position. Breathe out normally. Place the mouthpiece in your mouth and seal your lips tightly around it. Breathe in slowly and as deeply as possible, raising the piston or the ball toward the top of the column. Hold your breath for 3-5 seconds or for as long as possible. Allow the piston or ball to fall to the bottom of the column. Remove the mouthpiece from your mouth and breathe out normally. Rest for a few seconds and repeat Steps 1 through 7 at least 10 times every  1-2 hours when you are awake. Take your time and take a few normal breaths between deep breaths. The spirometer may include an indicator to show your best effort. Use the indicator as a goal to work toward during each repetition. After each set of 10 deep breaths, practice coughing to be sure your lungs are clear. If you have an incision (the cut made at the time of surgery), support your incision when coughing by placing a pillow or rolled up towels firmly against it. Once you are able to get out of bed, walk around indoors and cough well. You may stop using the incentive spirometer when instructed by your caregiver.  RISKS AND COMPLICATIONS Take your time so you do not get dizzy or light-headed. If you are in pain, you may need to take or ask for pain medication before doing incentive  spirometry. It is harder to take a deep breath if you are having pain. AFTER USE Rest and breathe slowly and easily. It can be helpful to keep track of a log of your progress. Your caregiver can provide you with a simple table to help with this. If you are using the spirometer at home, follow these instructions: SEEK MEDICAL CARE IF:  You are having difficultly using the spirometer. You have trouble using the spirometer as often as instructed. Your pain medication is not giving enough relief while using the spirometer. You develop fever of 100.5 F (38.1 C) or higher. SEEK IMMEDIATE MEDICAL CARE IF:  You cough up bloody sputum that had not been present before. You develop fever of 102 F (38.9 C) or greater. You develop worsening pain at or near the incision site. MAKE SURE YOU:  Understand these instructions. Will watch your condition. Will get help right away if you are not doing well or get worse. Document Released: 04/22/2007 Document Revised: 03/03/2012 Document Reviewed: 06/23/2007 [facility] Patient Information 2014 [place], [place].   ________________________________________________________________________

## 2023-07-03 ENCOUNTER — Other Ambulatory Visit: Payer: Self-pay

## 2023-07-03 ENCOUNTER — Encounter (HOSPITAL_COMMUNITY)
Admission: RE | Admit: 2023-07-03 | Discharge: 2023-07-03 | Disposition: A | Discharge status: Home / Self Care | Payer: Medicare Other | Source: Ambulatory Visit | Attending: Orthopedic Surgery | Admitting: Orthopedic Surgery

## 2023-07-03 ENCOUNTER — Encounter (HOSPITAL_COMMUNITY): Payer: Self-pay

## 2023-07-03 VITALS — BP 140/90 | HR 78 | Temp 97.9°F | Ht 65.0 in | Wt 143.0 lb

## 2023-07-03 DIAGNOSIS — I2510 Atherosclerotic heart disease of native coronary artery without angina pectoris: Secondary | ICD-10-CM | POA: Insufficient documentation

## 2023-07-03 DIAGNOSIS — Z01818 Encounter for other preprocedural examination: Secondary | ICD-10-CM | POA: Insufficient documentation

## 2023-07-03 LAB — BASIC METABOLIC PANEL
Anion gap: 7 (ref 5–15)
BUN: 15 mg/dL (ref 8–23)
CO2: 26 mmol/L (ref 22–32)
Calcium: 8.8 mg/dL — ABNORMAL LOW (ref 8.9–10.3)
Chloride: 106 mmol/L (ref 98–111)
Creatinine, Ser: 0.9 mg/dL (ref 0.44–1.00)
GFR, Estimated: >60 mL/min (ref >60)
Glucose, Bld: 97 mg/dL (ref 70–99)
Potassium: 4.5 mmol/L (ref 3.5–5.1)
Sodium: 139 mmol/L (ref 135–145)

## 2023-07-03 LAB — CBC
HCT: 43 % (ref 36.0–46.0)
Hemoglobin: 14.1 g/dL (ref 12.0–15.0)
MCH: 31.8 pg (ref 26.0–34.0)
MCHC: 32.8 g/dL (ref 30.0–36.0)
MCV: 96.8 fL (ref 80.0–100.0)
Platelets: 293 10*3/uL (ref 150–400)
RBC: 4.44 MIL/uL (ref 3.87–5.11)
RDW: 12.1 % (ref 11.5–15.5)
WBC: 5.6 10*3/uL (ref 4.0–10.5)
nRBC: 0 % (ref 0.0–0.2)

## 2023-07-03 LAB — SURGICAL PCR SCREEN
MRSA, PCR: NEGATIVE
Staphylococcus aureus: NEGATIVE

## 2023-07-03 NOTE — Progress Notes (Signed)
For Short Stay: COVID SWAB appointment date:  Bowel Prep reminder:   For Anesthesia: PCP - Merri Brunette, MD . Clearance: Estill Cotta: FNP: 06/04/23 Cardiologist - N/A  Chest x-ray -  EKG - 06/04/23: Chart Stress Test -  ECHO -  Cardiac Cath -  Pacemaker/ICD device last checked: Pacemaker orders received: Device Rep notified:  Spinal Cord Stimulator: N/A  Sleep Study - N/A CPAP -   Fasting Blood Sugar - N/A Checks Blood Sugar _____ times a day Date and result of last Hgb A1c-  Last dose of GLP1 agonist- N/A GLP1 instructions:   Last dose of SGLT-2 inhibitors- N/A SGLT-2 instructions:   Blood Thinner Instructions: N/A Aspirin Instructions: Last Dose:  Activity level: Can go up a flight of stairs and activities of daily living without stopping and without chest pain and/or shortness of breath   Able to exercise without chest pain and/or shortness of breath  Anesthesia review: Hx: Heart Murmur.  Patient denies shortness of breath, fever, cough and chest pain at PAT appointment   Patient verbalized understanding of instructions that were given to them at the PAT appointment. Patient was also instructed that they will need to review over the PAT instructions again at home before surgery.

## 2023-07-08 NOTE — Anesthesia Preprocedure Evaluation (Signed)
Anesthesia Evaluation  Patient identified by MRN, date of birth, ID band Patient awake    Reviewed: Allergy & Precautions, NPO status , Patient's Chart, lab work & pertinent test results  History of Anesthesia Complications Negative for: history of anesthetic complications  Airway Mallampati: II  TM Distance: >3 FB Neck ROM: Full    Dental no notable dental hx. (+) Dental Advisory Given   Pulmonary neg pulmonary ROS   Pulmonary exam normal        Cardiovascular negative cardio ROS Normal cardiovascular exam     Neuro/Psych negative neurological ROS     GI/Hepatic negative GI ROS, Neg liver ROS,,,  Endo/Other  Hypothyroidism    Renal/GU negative Renal ROS     Musculoskeletal negative musculoskeletal ROS (+)    Abdominal   Peds  Hematology negative hematology ROS (+)   Anesthesia Other Findings   Reproductive/Obstetrics                             Anesthesia Physical Anesthesia Plan  ASA: 2  Anesthesia Plan: Spinal   Post-op Pain Management: Regional block*, Tylenol PO (pre-op)* and Celebrex PO (pre-op)*   Induction:   PONV Risk Score and Plan: 2 and Ondansetron, Dexamethasone and Propofol infusion  Airway Management Planned: Natural Airway  Additional Equipment:   Intra-op Plan:   Post-operative Plan:   Informed Consent: I have reviewed the patients History and Physical, chart, labs and discussed the procedure including the risks, benefits and alternatives for the proposed anesthesia with the patient or authorized representative who has indicated his/her understanding and acceptance.     Dental advisory given  Plan Discussed with: Anesthesiologist and CRNA  Anesthesia Plan Comments:        Anesthesia Quick Evaluation

## 2023-07-08 NOTE — H&P (Signed)
TOTAL KNEE ADMISSION H&P  Patient is being admitted for left total knee arthroplasty.  Therapy Plans: outpatient therapy at [facility] Disposition: Home with husband Planned DVT Prophylaxis: aspirin 81mg  BID DME needed: walker PCP: Dr. Renne Crigler ( saw his PC for clearance last thursday) form pending TXA: IV Allergies: NKDA Anesthesia Concerns: none BMI: 24 Last HgbA1c: Not diabetic   Other: - oxycodone, robaxin, tylenol, celebrex - No hx of stomach ulcer, GI bleed - No hx of blood clot or cancer - SDD - ice machine  Subjective:  Chief Complaint:left knee pain.  HPI: [name], 72 y.o. female, has a history of pain and functional disability in the left knee due to arthritis and has failed non-surgical conservative treatments for greater than 12 weeks to includeNSAID's and/or analgesics, corticosteriod injections, and activity modification.  Onset of symptoms was gradual, starting 2 years ago with gradually worsening course since that time. The patient noted no past surgery on the left knee(s).  Patient currently rates pain in the left knee(s) at 8 out of 10 with activity. Patient has worsening of pain with activity and weight bearing and pain that interferes with activities of daily living.  Patient has evidence of joint space narrowing by imaging studies.  There is no active infection.  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   Past Medical History:  Diagnosis Date   Arthritis    Family history of adverse reaction to anesthesia    pt states mother developed dementia from anesthesia at age 27   Headache    hx of migraines last one 12 years ago    Heart murmur    Hypothyroidism     Past Surgical History:  Procedure Laterality Date   COLONOSCOPY  04/03/2013   [name]   TONSILLECTOMY     TOTAL HIP ARTHROPLASTY Right 06/28/2015   Procedure: RIGHT TOTAL HIP ARTHROPLASTY ANTERIOR APPROACH;  Surgeon: Durene Romans, MD;  Location: [facility];   Service: Orthopedics;  Laterality: Right;   WISDOM TOOTH EXTRACTION  12/25/1971    No current facility-administered medications for this encounter.   Current Outpatient Medications  Medication Sig Dispense Refill Last Dose   acidophilus (RISAQUAD) CAPS capsule Take 1 capsule by mouth daily.      CALCIUM PO Take 500 mg by mouth daily.      ibuprofen (ADVIL) 200 MG tablet Take 200-400 mg by mouth every 6 (six) hours as needed for moderate pain.      levothyroxine (SYNTHROID, LEVOTHROID) 100 MCG tablet Take 100 mcg by mouth daily before breakfast.      MAGNESIUM PO Take 1 tablet by mouth daily.      Multiple Vitamin (MULTIVITAMIN) capsule Take 1 capsule by mouth every morning.       Omega-3 Fatty Acids (FISH OIL PO) Take 1 tablet by mouth every morning.       Polyethyl Glycol-Propyl Glycol (SYSTANE OP) Place 1 drop into both eyes daily as needed (dry/irritated eyes).      No Known Allergies  Social History   Tobacco Use   Smoking status: Never   Smokeless tobacco: Never  Substance Use Topics   Alcohol use: Yes    Alcohol/week: 4.0 standard drinks of alcohol    Types: 4 Glasses of wine per week    Comment: socially     Family History  Problem Relation Age of Onset   Colon cancer Mother 16   Colon polyps Brother    Esophageal  cancer Neg Hx    Stomach cancer Neg Hx    Rectal cancer Neg Hx      Review of Systems  Constitutional:  Negative for chills and fever.  Respiratory:  Negative for cough and shortness of breath.   Cardiovascular:  Negative for chest pain.  Gastrointestinal:  Negative for nausea and vomiting.  Musculoskeletal:  Positive for arthralgias.     Objective:  Physical Exam Well nourished and well developed. General: Alert and oriented x3, cooperative and pleasant, no acute distress. Head: normocephalic, atraumatic, neck supple. Eyes: EOMI.  Musculoskeletal: Left knee exam: No palpable effusion, warmth erythema Near full active and passive extension  with flexion to 120 degrees Tenderness mainly over the medial and anterior aspect knee Stable medial and lateral collateral ligaments   Calves soft and nontender. Motor function intact in LE. Strength 5/5 LE bilaterally. Neuro: Distal pulses 2+. Sensation to light touch intact in LE.  Vital signs in last 24 hours:    Labs:   Estimated body mass index is 23.8 kg/m as calculated from the following:   Height as of 07/03/23: 5\' 5"  (1.651 m).   Weight as of 07/03/23: 64.9 kg.   Imaging Review Plain radiographs demonstrate severe degenerative joint disease of the left knee(s). The overall alignment isneutral. The bone quality appears to be adequate for age and reported activity level.      Assessment/Plan:  End stage arthritis, left knee   The patient history, physical examination, clinical judgment of the provider and imaging studies are consistent with end stage degenerative joint disease of the left knee(s) and total knee arthroplasty is deemed medically necessary. The treatment options including medical management, injection therapy arthroscopy and arthroplasty were discussed at length. The risks and benefits of total knee arthroplasty were presented and reviewed. The risks due to aseptic loosening, infection, stiffness, patella tracking problems, thromboembolic complications and other imponderables were discussed. The patient acknowledged the explanation, agreed to proceed with the plan and consent was signed. Patient is being admitted for inpatient treatment for surgery, pain control, PT, OT, prophylactic antibiotics, VTE prophylaxis, progressive ambulation and ADL's and discharge planning. The patient is planning to be discharged  home.     Patient's anticipated LOS is less than 2 midnights, meeting these requirements: - Younger than 2 - Lives within 1 hour of care - Has a competent adult at home to recover with post-op recover - NO history of  - Chronic pain requiring  opiods  - Diabetes  - Coronary Artery Disease  - Heart failure  - Heart attack  - Stroke  - DVT/VTE  - Cardiac arrhythmia  - Respiratory Failure/COPD  - Renal failure  - Anemia  - Advanced Liver disease  [name], PA-C Orthopedic Surgery [facility] [place] [phone]

## 2023-07-09 ENCOUNTER — Ambulatory Visit (HOSPITAL_BASED_OUTPATIENT_CLINIC_OR_DEPARTMENT_OTHER): Payer: Medicare Other | Admitting: Anesthesiology

## 2023-07-09 ENCOUNTER — Ambulatory Visit (HOSPITAL_COMMUNITY)
Admission: RE | Admit: 2023-07-09 | Discharge: 2023-07-09 | Disposition: A | Discharge status: Home / Self Care | Payer: Medicare Other | Attending: Orthopedic Surgery | Admitting: Orthopedic Surgery

## 2023-07-09 ENCOUNTER — Ambulatory Visit (HOSPITAL_COMMUNITY): Payer: Medicare Other | Admitting: Anesthesiology

## 2023-07-09 ENCOUNTER — Encounter (HOSPITAL_COMMUNITY)
Admission: RE | Disposition: A | Discharge status: Home / Self Care | Payer: Self-pay | Source: Home / Self Care | Attending: Orthopedic Surgery

## 2023-07-09 ENCOUNTER — Other Ambulatory Visit: Payer: Self-pay

## 2023-07-09 ENCOUNTER — Encounter (HOSPITAL_COMMUNITY): Payer: Self-pay | Admitting: Orthopedic Surgery

## 2023-07-09 DIAGNOSIS — M1712 Unilateral primary osteoarthritis, left knee: Secondary | ICD-10-CM | POA: Insufficient documentation

## 2023-07-09 DIAGNOSIS — G8918 Other acute postprocedural pain: Secondary | ICD-10-CM | POA: Diagnosis not present

## 2023-07-09 DIAGNOSIS — E039 Hypothyroidism, unspecified: Secondary | ICD-10-CM | POA: Insufficient documentation

## 2023-07-09 DIAGNOSIS — Z96652 Presence of left artificial knee joint: Secondary | ICD-10-CM

## 2023-07-09 DIAGNOSIS — I2510 Atherosclerotic heart disease of native coronary artery without angina pectoris: Secondary | ICD-10-CM

## 2023-07-09 HISTORY — PX: TOTAL KNEE ARTHROPLASTY: SHX125

## 2023-07-09 SURGERY — ARTHROPLASTY, KNEE, TOTAL
Anesthesia: Spinal | Site: Knee | Laterality: Left

## 2023-07-09 MED ORDER — PROPOFOL 1000 MG/100ML IV EMUL
INTRAVENOUS | Status: AC
  Filled 2023-07-09: qty 100

## 2023-07-09 MED ORDER — ACETAMINOPHEN 10 MG/ML IV SOLN
1000.0000 mg | Freq: Once | INTRAVENOUS | Status: DC | PRN
  Administered 2023-07-09: 1000 mg via INTRAVENOUS

## 2023-07-09 MED ORDER — DEXAMETHASONE SODIUM PHOSPHATE 10 MG/ML IJ SOLN
INTRAMUSCULAR | Status: AC
  Filled 2023-07-09: qty 1

## 2023-07-09 MED ORDER — FENTANYL CITRATE PF 50 MCG/ML IJ SOSY: 25.0000 ug | PREFILLED_SYRINGE | INTRAMUSCULAR | Status: DC | PRN

## 2023-07-09 MED ORDER — OXYCODONE HCL 5 MG PO TABS: 5.0000 mg | ORAL_TABLET | Freq: Once | ORAL | Status: DC | PRN

## 2023-07-09 MED ORDER — MIDAZOLAM HCL 2 MG/2ML IJ SOLN
INTRAMUSCULAR | Status: AC
  Filled 2023-07-09: qty 2

## 2023-07-09 MED ORDER — LACTATED RINGERS IV BOLUS
500.0000 mL | Freq: Once | INTRAVENOUS | Status: AC
  Administered 2023-07-09: 500 mL via INTRAVENOUS

## 2023-07-09 MED ORDER — PROPOFOL 10 MG/ML IV BOLUS
INTRAVENOUS | Status: DC | PRN
  Administered 2023-07-09: 20 mg via INTRAVENOUS

## 2023-07-09 MED ORDER — AMISULPRIDE (ANTIEMETIC) 5 MG/2ML IV SOLN: 10.0000 mg | Freq: Once | INTRAVENOUS | Status: DC | PRN

## 2023-07-09 MED ORDER — OXYCODONE HCL 5 MG/5ML PO SOLN: 5.0000 mg | Freq: Once | ORAL | Status: DC | PRN

## 2023-07-09 MED ORDER — CEFAZOLIN SODIUM-DEXTROSE 2-4 GM/100ML-% IV SOLN: 2.0000 g | Freq: Four times a day (QID) | INTRAVENOUS | Status: DC

## 2023-07-09 MED ORDER — BUPIVACAINE-EPINEPHRINE 0.25% -1:200000 IJ SOLN
INTRAMUSCULAR | Status: AC
  Filled 2023-07-09: qty 1

## 2023-07-09 MED ORDER — ONDANSETRON HCL 4 MG/2ML IJ SOLN
INTRAMUSCULAR | Status: DC | PRN
  Administered 2023-07-09: 4 mg via INTRAVENOUS

## 2023-07-09 MED ORDER — MIDAZOLAM HCL 5 MG/5ML IJ SOLN
INTRAMUSCULAR | Status: DC | PRN
  Administered 2023-07-09: 2 mg via INTRAVENOUS

## 2023-07-09 MED ORDER — PROPOFOL 500 MG/50ML IV EMUL
INTRAVENOUS | Status: DC | PRN
  Administered 2023-07-09: 109 ug/kg/min via INTRAVENOUS

## 2023-07-09 MED ORDER — ONDANSETRON HCL 4 MG/2ML IJ SOLN
INTRAMUSCULAR | Status: AC
  Filled 2023-07-09: qty 2

## 2023-07-09 MED ORDER — POVIDONE-IODINE 10 % EX SWAB: 2.0000 | Freq: Once | CUTANEOUS | Status: DC

## 2023-07-09 MED ORDER — CHLORHEXIDINE GLUCONATE 0.12 % MT SOLN
15.0000 mL | Freq: Once | OROMUCOSAL | Status: AC
  Administered 2023-07-09: 15 mL via OROMUCOSAL

## 2023-07-09 MED ORDER — METHOCARBAMOL 500 MG IVPB - SIMPLE MED: 500.0000 mg | Freq: Four times a day (QID) | INTRAVENOUS | Status: DC | PRN

## 2023-07-09 MED ORDER — SODIUM CHLORIDE 0.9 % IR SOLN
Status: DC | PRN
  Administered 2023-07-09: 1000 mL

## 2023-07-09 MED ORDER — METHOCARBAMOL 500 MG PO TABS: 500.0000 mg | ORAL_TABLET | Freq: Four times a day (QID) | ORAL | Status: DC | PRN

## 2023-07-09 MED ORDER — METHOCARBAMOL 500 MG IVPB - SIMPLE MED
INTRAVENOUS | Status: AC
  Administered 2023-07-09: 500 mg via INTRAVENOUS
  Filled 2023-07-09: qty 55

## 2023-07-09 MED ORDER — FENTANYL CITRATE (PF) 100 MCG/2ML IJ SOLN
INTRAMUSCULAR | Status: DC | PRN
  Administered 2023-07-09 (×2): 50 ug via INTRAVENOUS

## 2023-07-09 MED ORDER — SODIUM CHLORIDE (PF) 0.9 % IJ SOLN
INTRAMUSCULAR | Status: AC
  Filled 2023-07-09: qty 10

## 2023-07-09 MED ORDER — LACTATED RINGERS IV SOLN: INTRAVENOUS | Status: DC

## 2023-07-09 MED ORDER — ORAL CARE MOUTH RINSE: 15.0000 mL | Freq: Once | OROMUCOSAL | Status: AC

## 2023-07-09 MED ORDER — FENTANYL CITRATE (PF) 100 MCG/2ML IJ SOLN
INTRAMUSCULAR | Status: AC
  Filled 2023-07-09: qty 2

## 2023-07-09 MED ORDER — CELECOXIB 200 MG PO CAPS
200.0000 mg | ORAL_CAPSULE | Freq: Once | ORAL | Status: AC
  Administered 2023-07-09: 200 mg via ORAL
  Filled 2023-07-09: qty 1

## 2023-07-09 MED ORDER — ACETAMINOPHEN 500 MG PO TABS
1000.0000 mg | ORAL_TABLET | Freq: Once | ORAL | Status: AC
  Administered 2023-07-09: 1000 mg via ORAL
  Filled 2023-07-09: qty 2

## 2023-07-09 MED ORDER — PHENYLEPHRINE 80 MCG/ML (10ML) SYRINGE FOR IV PUSH (FOR BLOOD PRESSURE SUPPORT)
PREFILLED_SYRINGE | INTRAVENOUS | Status: AC
  Filled 2023-07-09: qty 10

## 2023-07-09 MED ORDER — BUPIVACAINE-EPINEPHRINE (PF) 0.25% -1:200000 IJ SOLN
INTRAMUSCULAR | Status: DC | PRN
  Administered 2023-07-09: 30 mL

## 2023-07-09 MED ORDER — CEFAZOLIN SODIUM-DEXTROSE 2-4 GM/100ML-% IV SOLN
2.0000 g | INTRAVENOUS | Status: AC
  Administered 2023-07-09: 2 g via INTRAVENOUS
  Filled 2023-07-09: qty 100

## 2023-07-09 MED ORDER — TRANEXAMIC ACID-NACL 1000-0.7 MG/100ML-% IV SOLN
INTRAVENOUS | Status: AC
  Administered 2023-07-09: 1000 mg via INTRAVENOUS
  Filled 2023-07-09: qty 100

## 2023-07-09 MED ORDER — TRANEXAMIC ACID-NACL 1000-0.7 MG/100ML-% IV SOLN: 1000.0000 mg | Freq: Once | INTRAVENOUS | Status: AC

## 2023-07-09 MED ORDER — ACETAMINOPHEN 10 MG/ML IV SOLN
INTRAVENOUS | Status: AC
  Filled 2023-07-09: qty 100

## 2023-07-09 MED ORDER — KETOROLAC TROMETHAMINE 30 MG/ML IJ SOLN
INTRAMUSCULAR | Status: AC
  Filled 2023-07-09: qty 1

## 2023-07-09 MED ORDER — TRANEXAMIC ACID-NACL 1000-0.7 MG/100ML-% IV SOLN
1000.0000 mg | INTRAVENOUS | Status: AC
  Administered 2023-07-09: 1000 mg via INTRAVENOUS
  Filled 2023-07-09: qty 100

## 2023-07-09 MED ORDER — CEFAZOLIN SODIUM-DEXTROSE 2-4 GM/100ML-% IV SOLN
INTRAVENOUS | Status: AC
  Administered 2023-07-09: 2 g via INTRAVENOUS
  Filled 2023-07-09: qty 100

## 2023-07-09 MED ORDER — STERILE WATER FOR IRRIGATION IR SOLN
Status: DC | PRN
  Administered 2023-07-09: 2000 mL

## 2023-07-09 MED ORDER — PHENYLEPHRINE 80 MCG/ML (10ML) SYRINGE FOR IV PUSH (FOR BLOOD PRESSURE SUPPORT)
PREFILLED_SYRINGE | INTRAVENOUS | Status: DC | PRN
  Administered 2023-07-09 (×2): 120 ug via INTRAVENOUS
  Administered 2023-07-09: 160 ug via INTRAVENOUS
  Administered 2023-07-09: 80 ug via INTRAVENOUS
  Administered 2023-07-09 (×2): 160 ug via INTRAVENOUS
  Administered 2023-07-09 (×2): 80 ug via INTRAVENOUS
  Administered 2023-07-09: 160 ug via INTRAVENOUS
  Administered 2023-07-09: 80 ug via INTRAVENOUS

## 2023-07-09 MED ORDER — SODIUM CHLORIDE (PF) 0.9 % IJ SOLN
INTRAMUSCULAR | Status: DC | PRN
  Administered 2023-07-09: 30 mL

## 2023-07-09 MED ORDER — DEXAMETHASONE SODIUM PHOSPHATE 10 MG/ML IJ SOLN
8.0000 mg | Freq: Once | INTRAMUSCULAR | Status: AC
  Administered 2023-07-09: 8 mg via INTRAVENOUS

## 2023-07-09 MED ORDER — 0.9 % SODIUM CHLORIDE (POUR BTL) OPTIME
TOPICAL | Status: DC | PRN
  Administered 2023-07-09: 1000 mL

## 2023-07-09 MED ORDER — KETOROLAC TROMETHAMINE 30 MG/ML IJ SOLN
INTRAMUSCULAR | Status: DC | PRN
  Administered 2023-07-09: 30 mg

## 2023-07-09 SURGICAL SUPPLY — 50 items
ADH SKN CLS APL DERMABOND .7 (GAUZE/BANDAGES/DRESSINGS) ×1
ATTUNE MED ANAT PAT 35 KNEE (Knees) IMPLANT
BASEPLATE TIB CMT FB PCKT SZ4 (Stem) IMPLANT
BLADE SAW SGTL 13.0X1.19X90.0M (BLADE) ×1 IMPLANT
BNDG CMPR 5X62 HK CLSR LF (GAUZE/BANDAGES/DRESSINGS) ×1
BNDG CMPR 6 X 5 YARDS HK CLSR (GAUZE/BANDAGES/DRESSINGS) ×1
BNDG CMPR 6"X 5 YARDS HK CLSR (GAUZE/BANDAGES/DRESSINGS) ×1
BNDG ELASTIC 6INX 5YD STR LF (GAUZE/BANDAGES/DRESSINGS) ×1 IMPLANT
BOWL SMART MIX CTS (DISPOSABLE) ×1 IMPLANT
BSPLAT TIB 4 CMNT FX BRNG STRL (Stem) ×1 IMPLANT
CEMENT HV SMART SET (Cement) IMPLANT
COMP FEM CMT ATTUNE NRW 5 LT (Joint) ×1 IMPLANT
COMPONENT FEM CMT ATTN NRW 5LT (Joint) IMPLANT
COOLER ICEMAN CLASSIC (MISCELLANEOUS) IMPLANT
COVER SURGICAL LIGHT HANDLE (MISCELLANEOUS) ×1 IMPLANT
CUFF TOURN SGL QUICK 34 (TOURNIQUET CUFF) ×1
CUFF TRNQT CYL 34X4.125X (TOURNIQUET CUFF) ×1 IMPLANT
DERMABOND ADVANCED .7 DNX12 (GAUZE/BANDAGES/DRESSINGS) ×1 IMPLANT
DRAPE U-SHAPE 47X51 STRL (DRAPES) ×1 IMPLANT
DRSG AQUACEL AG ADV 3.5X10 (GAUZE/BANDAGES/DRESSINGS) IMPLANT
DURAPREP 26ML APPLICATOR (WOUND CARE) ×2 IMPLANT
ELECT REM PT RETURN 15FT ADLT (MISCELLANEOUS) ×1 IMPLANT
GLOVE BIO SURGEON STRL SZ 6 (GLOVE) ×1 IMPLANT
GLOVE BIOGEL PI IND STRL 6.5 (GLOVE) ×1 IMPLANT
GLOVE BIOGEL PI IND STRL 7.5 (GLOVE) ×1 IMPLANT
GLOVE ORTHO TXT STRL SZ7.5 (GLOVE) ×2 IMPLANT
GOWN STRL REUS W/ TWL LRG LVL3 (GOWN DISPOSABLE) ×2 IMPLANT
GOWN STRL REUS W/TWL LRG LVL3 (GOWN DISPOSABLE) ×2
HANDPIECE INTERPULSE COAX TIP (DISPOSABLE) ×1
INSERT MED ATTUNE KNEE 5 6 LT (Insert) IMPLANT
KIT TURNOVER KIT A (KITS) IMPLANT
MANIFOLD NEPTUNE II (INSTRUMENTS) ×1 IMPLANT
NS IRRIG 1000ML POUR BTL (IV SOLUTION) ×1 IMPLANT
PACK TOTAL KNEE CUSTOM (KITS) ×1 IMPLANT
PAD COLD SHLDR UNI WRAP-ON (PAD) ×1
PAD COLD UNI WRAP-ON (PAD) IMPLANT
PIN FIX SIGMA LCS THRD HI (PIN) IMPLANT
PROTECTOR NERVE ULNAR (MISCELLANEOUS) ×1 IMPLANT
SET HNDPC FAN SPRY TIP SCT (DISPOSABLE) ×1 IMPLANT
SET PAD KNEE POSITIONER (MISCELLANEOUS) ×1 IMPLANT
SUT MNCRL AB 4-0 PS2 18 (SUTURE) ×1 IMPLANT
SUT STRATAFIX PDS+ 0 24IN (SUTURE) ×1 IMPLANT
SUT VIC AB 1 CT1 36 (SUTURE) ×1 IMPLANT
SUT VIC AB 2-0 CT1 27 (SUTURE) ×2
SUT VIC AB 2-0 CT1 TAPERPNT 27 (SUTURE) ×2 IMPLANT
SYR 3ML LL SCALE MARK (SYRINGE) ×1 IMPLANT
TOWEL GREEN STERILE FF (TOWEL DISPOSABLE) ×1 IMPLANT
TRAY FOLEY MTR SLVR 16FR STAT (SET/KITS/TRAYS/PACK) ×1 IMPLANT
TUBE SUCTION HIGH CAP CLEAR NV (SUCTIONS) ×1 IMPLANT
WATER STERILE IRR 1000ML POUR (IV SOLUTION) ×2 IMPLANT

## 2023-07-09 NOTE — Transfer of Care (Signed)
Immediate Anesthesia Transfer of Care Note  Patient: Tonya Dawson  Procedure(s) Performed: TOTAL KNEE ARTHROPLASTY (Left: Knee)  Patient Location: PACU  Anesthesia Type:Spinal  Level of Consciousness: awake, alert , oriented, and patient cooperative  Airway & Oxygen Therapy: Patient Spontanous Breathing and Patient connected to nasal cannula oxygen  Post-op Assessment: Report given to RN and Post -op Vital signs reviewed and stable  Post vital signs: Reviewed and stable  Last Vitals:  Vitals Value Taken Time  BP 94/53 07/09/23 0901  Temp    Pulse 74 07/09/23 0902  Resp 18 07/09/23 0902  SpO2 96 % 07/09/23 0902  Vitals shown include unfiled device data.  Last Pain:  Vitals:   07/09/23 0557  TempSrc:   PainSc: 0-No pain         Complications: No notable events documented.

## 2023-07-09 NOTE — Interval H&P Note (Signed)
History and Physical Interval Note:  07/09/2023 8:39 AM  Tonya Dawson  has presented today for surgery, with the diagnosis of Left knee osteoarthritis.  The various methods of treatment have been discussed with the patient and family. After consideration of risks, benefits and other options for treatment, the patient has consented to  Procedure(s): TOTAL KNEE ARTHROPLASTY (Left) as a surgical intervention.  The patient's history has been reviewed, patient examined, no change in status, stable for surgery.  I have reviewed the patient's chart and labs.  Questions were answered to the patient's satisfaction.     [name]

## 2023-07-09 NOTE — Anesthesia Procedure Notes (Signed)
Procedure Name: MAC Date/Time: 07/09/2023 7:23 AM  Performed by: Maurene Capes, CRNAPre-anesthesia Checklist: Patient identified, Emergency Drugs available, Suction available and Patient being monitored Patient Re-evaluated:Patient Re-evaluated prior to induction Oxygen Delivery Method: Simple face mask Induction Type: IV induction Placement Confirmation: positive ETCO2 Dental Injury: Teeth and Oropharynx as per pre-operative assessment

## 2023-07-09 NOTE — Op Note (Signed)
NAME:  Tonya Dawson                      MEDICAL RECORD NO.:  [id number]                             FACILITY:  [facility]      PHYSICIAN:  Madlyn Frankel. Charlann Boxer, M.D.  DATE OF BIRTH:  Jan 20, 1951      DATE OF PROCEDURE:  07/09/2023                                     OPERATIVE REPORT         PREOPERATIVE DIAGNOSIS:  Left knee osteoarthritis.      POSTOPERATIVE DIAGNOSIS:  Left knee osteoarthritis.      FINDINGS:  The patient was noted to have complete loss of cartilage and   bone-on-bone arthritis with associated osteophytes in the medial and patellofemoral compartments of   the knee with a significant synovitis and associated effusion.  The patient had failed months of conservative treatment including medications, injection therapy, activity modification.     PROCEDURE:  Left total knee replacement.      COMPONENTS USED:  DePuy Attune FB CR MS knee   system, a size 5N femur, 4 tibia, size 6 mm CR MS AOX insert, and 35 anatomic patellar   button.      SURGEON:  Madlyn Frankel. Charlann Boxer, M.D.      ASSISTANT:  [name], PA-C.      ANESTHESIA:  Regional and Spinal.      SPECIMENS:  None.      COMPLICATION:  None.      DRAINS:  None.  EBL: <200 cc      TOURNIQUET TIME:   Total Tourniquet Time Documented: Thigh (Left) - 27 minutes Total: Thigh (Left) - 27 minutes  .      The patient was stable to the recovery room.      INDICATION FOR PROCEDURE:  Tonya Dawson is a 72 y.o. female patient of   mine.  The patient had been seen, evaluated, and treated for months conservatively in the   office with medication, activity modification, and injections.  The patient had   radiographic changes of bone-on-bone arthritis with endplate sclerosis and osteophytes noted.  Based on the radiographic changes and failed conservative measures, the patient   decided to proceed with definitive treatment, total knee replacement.  Risks of infection, DVT, component failure, need for revision surgery,  neurovascular injury were reviewed in the office setting.  The postop course was reviewed stressing the efforts to maximize post-operative satisfaction and function.  Consent was obtained for benefit of pain   relief.      PROCEDURE IN DETAIL:  The patient was brought to the operative theater.   Once adequate anesthesia, preoperative antibiotics, 2 gm of Ancef,1 gm of Tranexamic Acid, and 10 mg of Decadron administered, the patient was positioned supine with a left thigh tourniquet placed.  The  left lower extremity was prepped and draped in sterile fashion.  A time-   out was performed identifying the patient, planned procedure, and the appropriate extremity.      The left lower extremity was placed in the [facility] leg holder.  The leg was   exsanguinated, tourniquet elevated to 225 mmHg.  A midline incision was  made followed by median parapatellar arthrotomy.  Following initial   exposure, attention was first directed to the patella.  Precut   measurement was noted to be 22 mm.  I resected down to 13 mm and used a   35 anatomic patellar button to restore patellar height as well as cover the cut surface.      The lug holes were drilled and a metal shim was placed to protect the   patella from retractors and saw blade during the procedure.      At this point, attention was now directed to the femur.  The femoral   canal was opened with a drill, irrigated to try to prevent fat emboli.  An   intramedullary rod was passed at 3 degrees valgus, 9 mm of bone was   resected off the distal femur.  Following this resection, the tibia was   subluxated anteriorly.  Using the extramedullary guide, 2 mm of bone was resected off   the proximal medial tibia.  We confirmed the gap would be   stable medially and laterally with a size 5 spacer block as well as confirmed that the tibial cut was perpendicular in the coronal plane, checking with an alignment rod.      Once this was done, I sized the femur to be  a size 5 in the anterior-   posterior dimension, chose a narrow component based on medial and   lateral dimension.  The size 5 rotation block was then pinned in   position anterior referenced using the C-clamp to set rotation.  The   anterior, posterior, and  chamfer cuts were made without difficulty nor   notching making certain that I was along the anterior cortex to help   with flexion gap stability.      The final box cut was made off the lateral aspect of distal femur.      At this point, the tibia was sized to be a size 4.  The size 4 tray was   then pinned in position through the medial third of the tubercle,   drilled, and keel punched.  Trial reduction was now carried with a 5 femur,  4 tibia, a size 6 mm CR MS insert, and the 35 anatomic patella botton.  The knee was brought to full extension with good flexion stability with the patella   tracking through the trochlea without application of pressure.  Given   all these findings the trial components removed.  Final components were   opened and cement was mixed.  The knee was irrigated with normal saline solution and pulse lavage.  The synovial lining was   then injected with 30 cc of 0.25% Marcaine with epinephrine, 1 cc of Toradol and 30 cc of NS for a total of 61 cc.     Final implants were then cemented onto cleaned and dried cut surfaces of bone with the knee brought to extension with a size 6 mm CR MS trial insert.      Once the cement had fully cured, excess cement was removed   throughout the knee.  I confirmed that I was satisfied with the range of   motion and stability, and the final size 6 mm CR MS AOX insert was chosen.  It was   placed into the knee.      The tourniquet had been let down at 27 minutes.  No significant   hemostasis was required.  The extensor mechanism was then reapproximated  using #1 Vicryl and #1 Stratafix sutures with the knee   in flexion.  The   remaining wound was closed with 2-0 Vicryl and  running 4-0 Monocryl.   The knee was cleaned, dried, dressed sterilely using Dermabond and   Aquacel dressing.  The patient was then   brought to recovery room in stable condition, tolerating the procedure   well.   Please note that Physician Assistant, [name], PA-C was present for the entirety of the case, and was utilized for pre-operative positioning, peri-operative retractor management, general facilitation of the procedure and for primary wound closure at the end of the case.              Madlyn Frankel Charlann Boxer, M.D.    07/09/2023 8:42 AM

## 2023-07-09 NOTE — Discharge Instructions (Signed)

## 2023-07-09 NOTE — Anesthesia Postprocedure Evaluation (Signed)
Anesthesia Post Note  Patient: Tonya Dawson  Procedure(s) Performed: TOTAL KNEE ARTHROPLASTY (Left: Knee)     Patient location during evaluation: PACU Anesthesia Type: Spinal Level of consciousness: awake and alert Pain management: pain level controlled Vital Signs Assessment: post-procedure vital signs reviewed and stable Respiratory status: spontaneous breathing and respiratory function stable Cardiovascular status: blood pressure returned to baseline and stable Postop Assessment: spinal receding Anesthetic complications: no   No notable events documented.  Last Vitals:  Vitals:   07/09/23 0945 07/09/23 1000  BP: 116/73 126/74  Pulse: 69 64  Resp: 10 11  Temp:    SpO2: 97% 98%    Last Pain:  Vitals:   07/09/23 1000  TempSrc:   PainSc: 0-No pain                 [name] DANIEL

## 2023-07-09 NOTE — Care Plan (Signed)
Ortho Bundle Case Management Note  Patient Details  Name: Tonya Dawson MRN: [id number] Date of Birth: 07/15/1951                  L TKA on 07/09/23.  DCP: Home with husband.  DME: RW ordered through [employer].  PT: EO 7/19   DME Arranged:  Dan Humphreys rolling DME Agency:  [employer]    Additional Comments: Please contact me with any questions of if this plan should need to change.   Ennis Forts, RN,CCM [facility]  [phone] 07/09/2023, 8:37 AM

## 2023-07-09 NOTE — Evaluation (Signed)
Physical Therapy Evaluation Patient Details Name: Tonya Dawson MRN: [id number] DOB: [birth year] Today's Date: 07/09/2023  History of Present Illness  72 yo female presents to therapy s/p L TKA on 07/08/2023 due to failure of conservative measures. Pt PMH includes but is not limited to: HA, heart murmur, hypothyroidism and R THA.  Clinical Impression    Tonya Dawson is a 72 y.o. female POD 0 s/p L TKA. Patient reports IND with mobility at baseline. Patient is now limited by functional impairments (see PT problem list below) and requires min guard for transfers and gait with RW. Patient was able to ambulate 65 feet with RW and min guard and cues for safe walker management. Patient educated on safe sequencing for stair mobility, fall risk prevention, use of iceman, pain management and goal, car transfers pt and spouse verbalized understanding of safe guarding position for people assisting with mobility. Patient instructed in exercises to facilitate ROM and circulation reviewed and HO provided. Patient will benefit from continued skilled PT interventions to address impairments and progress towards PLOF. Patient has met mobility goals at adequate level for discharge home with family assist and OPPT services; will continue to follow if pt continues acute stay to progress towards Mod I goals.       Assistance Recommended at Discharge Intermittent Supervision/Assistance  If plan is discharge home, recommend the following:  Can travel by private vehicle  A little help with walking and/or transfers;A little help with bathing/dressing/bathroom;Assistance with cooking/housework;Assist for transportation;Help with stairs or ramp for entrance        Equipment Recommendations Rolling walker (2 wheels) (provided and adjusted at eval)  Recommendations for Other Services       Functional Status Assessment Patient has had a recent decline in their functional status and demonstrates the ability to make  significant improvements in function in a reasonable and predictable amount of time.     Precautions / Restrictions Precautions Precautions: Knee;Fall Restrictions Weight Bearing Restrictions: No      Mobility  Bed Mobility Overal bed mobility: Needs Assistance Bed Mobility: Supine to Sit     Supine to sit: Min guard     General bed mobility comments: min cues    Transfers Overall transfer level: Needs assistance Equipment used: Rolling walker (2 wheels) Transfers: Sit to/from Stand Sit to Stand: Min guard           General transfer comment: cues for proper UE placement, with supine to sit pt reported light headedness and with sit to stand from EOB. Bp assessed and noted signifiant decrease in systolic Bp, seated EOb 131/68 and in standing 105/79    Ambulation/Gait Ambulation/Gait assistance: Min guard Gait Distance (Feet): 65 Feet Assistive device: Rolling walker (2 wheels) Gait Pattern/deviations: Step-to pattern, Shuffle, Antalgic, Trunk flexed Gait velocity: decreased     General Gait Details: B UE support at RW to offload L LE in stance phase  Stairs Stairs: Yes Stairs assistance: Min guard Stair Management: One rail Right Number of Stairs: 2 General stair comments: cues for safety and sequencing with HHA on L side and husband present and education provided  Wheelchair Mobility     Tilt Bed    Modified Rankin (Stroke Patients Only)       Balance Overall balance assessment: Needs assistance, History of Falls Sitting-balance support: Feet supported Sitting balance-Leahy Scale: Good     Standing balance support: Bilateral upper extremity supported, During functional activity, Reliant on assistive device for balance Standing balance-Leahy Scale: Poor  Pertinent Vitals/Pain Pain Assessment Pain Assessment: 0-10 Pain Score: 3  Pain Location: L knee Pain Descriptors / Indicators: Aching, Discomfort,  Operative site guarding Pain Intervention(s): Limited activity within patient's tolerance, Monitored during session, Premedicated before session, Repositioned, Ice applied    Home Living Family/patient expects to be discharged to:: Private residence Living Arrangements: Spouse/significant other Available Help at Discharge: Family Type of Home: House Home Access: Stairs to enter Entrance Stairs-Rails: Right Entrance Stairs-Number of Steps: 2   Home Layout: Two level;Able to live on main level with bedroom/bathroom Home Equipment: Cane - single point      Prior Function Prior Level of Function : Independent/Modified Independent;Driving             Mobility Comments: IND  with all ADLs, self care tasks, driving       Hand Dominance        Extremity/Trunk Assessment        Lower Extremity Assessment Lower Extremity Assessment: LLE deficits/detail LLE Deficits / Details: ankle DF/PF 5/5; SLR < 10 degree lag LLE Sensation: WNL    Cervical / Trunk Assessment Cervical / Trunk Assessment:  (wfl)  Communication   Communication: No difficulties  Cognition Arousal/Alertness: Awake/alert Behavior During Therapy: WFL for tasks assessed/performed Overall Cognitive Status: Within Functional Limits for tasks assessed                                          General Comments      Exercises Total Joint Exercises Ankle Circles/Pumps: AROM, Both, 20 reps Quad Sets: AROM, Left, 5 reps Short Arc Quad: AROM, Left, 5 reps Heel Slides: AROM, Left, 5 reps Hip ABduction/ADduction: AROM, Left, 5 reps Straight Leg Raises: AROM, Left, 5 reps Knee Flexion: AROM, Left, 5 reps, Seated   Assessment/Plan    PT Assessment Patient needs continued PT services  PT Problem List Decreased range of motion;Decreased strength;Decreased activity tolerance;Decreased balance;Decreased mobility;Decreased coordination;Pain       PT Treatment Interventions DME instruction;Gait  training;Stair training;Functional mobility training;Therapeutic activities;Therapeutic exercise;Balance training;Neuromuscular re-education;Patient/family education;Modalities    PT Goals (Current goals can be found in the Care Plan section)  Acute Rehab PT Goals Patient Stated Goal: to hike in the [place], cycle and take the dogs for a walk without pain PT Goal Formulation: With patient Time For Goal Achievement: 07/23/23 Potential to Achieve Goals: Good    Frequency 7X/week     Co-evaluation               AM-PAC PT "6 Clicks" Mobility  Outcome Measure Help needed turning from your back to your side while in a flat bed without using bedrails?: A Little Help needed moving from lying on your back to sitting on the side of a flat bed without using bedrails?: A Little Help needed moving to and from a bed to a chair (including a wheelchair)?: A Little Help needed standing up from a chair using your arms (e.g., wheelchair or bedside chair)?: A Little Help needed to walk in hospital room?: A Little Help needed climbing 3-5 steps with a railing? : A Little 6 Click Score: 18    End of Session Equipment Utilized During Treatment: Gait belt Activity Tolerance: Patient tolerated treatment well Patient left: in chair;with call bell/phone within reach;with family/visitor present Nurse Communication: Mobility status;Other (comment) (Pt readiness for d/c from PT standpoint pending if pt reports of light headedness and needing  to void) PT Visit Diagnosis: Unsteadiness on feet (R26.81);Other abnormalities of gait and mobility (R26.89);Muscle weakness (generalized) (M62.81);Pain;Difficulty in walking, not elsewhere classified (R26.2) Pain - Right/Left: Left Pain - part of body: Knee;Leg    Time: 4782-9562 PT Time Calculation (min) (ACUTE ONLY): 68 min   Charges:   PT Evaluation $PT Eval Low Complexity: 1 Low PT Treatments $Gait Training: 8-22 mins $Therapeutic Exercise: 8-22  mins $Therapeutic Activity: 23-37 mins PT General Charges $$ ACUTE PT VISIT: 1 Visit         Johnny Bridge, PT Acute Rehab   Jacqualyn Posey 07/09/2023, 3:59 PM

## 2023-07-10 ENCOUNTER — Encounter (HOSPITAL_COMMUNITY): Payer: Self-pay | Admitting: Orthopedic Surgery

## 2023-07-10 MED ORDER — ROPIVACAINE HCL 7.5 MG/ML IJ SOLN
INTRAMUSCULAR | Status: DC | PRN
  Administered 2023-07-09: 20 mL via PERINEURAL

## 2023-07-10 MED ORDER — DEXAMETHASONE SODIUM PHOSPHATE 10 MG/ML IJ SOLN
INTRAMUSCULAR | Status: DC | PRN
  Administered 2023-07-09: 5 mg

## 2023-07-10 MED ORDER — BUPIVACAINE IN DEXTROSE 0.75-8.25 % IT SOLN
INTRATHECAL | Status: DC | PRN
  Administered 2023-07-09: 1.8 mL via INTRATHECAL

## 2023-07-10 NOTE — Addendum Note (Signed)
Addendum  created 07/10/23 1006 by Heather Roberts, MD   Child order released for a procedure order, Clinical Note Signed, Intraprocedure Blocks edited, Intraprocedure Meds edited, SmartForm saved

## 2023-07-10 NOTE — Anesthesia Procedure Notes (Signed)
Spinal  Patient location during procedure: OR Start time: 07/09/2023 7:18 AM End time: 07/10/2023 7:28 AM Reason for block: surgical anesthesia Staffing Performed: anesthesiologist  Anesthesiologist: Heather Roberts, MD Performed by: Heather Roberts, MD Authorized by: Heather Roberts, MD   Preanesthetic Checklist Completed: patient identified, IV checked, risks and benefits discussed, surgical consent, monitors and equipment checked, pre-op evaluation and timeout performed Spinal Block Patient position: sitting Prep: DuraPrep Patient monitoring: cardiac monitor, continuous pulse ox and blood pressure Approach: midline Location: L2-3 Injection technique: single-shot Needle Needle type: Pencan  Needle gauge: 24 G Needle length: 9 cm Assessment Events: CSF return Additional Notes Functioning IV was confirmed and monitors were applied. Sterile prep and drape, including hand hygiene and sterile gloves were used. The patient was positioned and the spine was prepped. The skin was anesthetized with lidocaine.  Free flow of clear CSF was obtained prior to injecting local anesthetic into the CSF.  The spinal needle aspirated freely following injection.  The needle was carefully withdrawn.  The patient tolerated the procedure well.

## 2023-07-10 NOTE — Anesthesia Procedure Notes (Signed)
Anesthesia Regional Block: Adductor canal block   Pre-Anesthetic Checklist: , timeout performed,  Correct Patient, Correct Site, Correct Laterality,  Correct Procedure, Correct Position, site marked,  Risks and benefits discussed,  Surgical consent,  Pre-op evaluation,  At surgeon's request and post-op pain management  Laterality: Left  Prep: chloraprep       Needles:  Injection technique: Single-shot  Needle Type: Stimulator Needle - 80     Needle Length: 10cm  Needle Gauge: 21     Additional Needles:   Narrative:  Start time: 07/09/2023 6:51 AM End time: 07/09/2023 7:01 AM Injection made incrementally with aspirations every 5 mL.  Performed by: Personally  Anesthesiologist: Heather Roberts, MD

## 2023-07-12 DIAGNOSIS — M25562 Pain in left knee: Secondary | ICD-10-CM | POA: Diagnosis not present

## 2023-07-15 DIAGNOSIS — M25562 Pain in left knee: Secondary | ICD-10-CM | POA: Diagnosis not present

## 2023-07-17 DIAGNOSIS — M25562 Pain in left knee: Secondary | ICD-10-CM | POA: Diagnosis not present

## 2023-07-19 DIAGNOSIS — M25562 Pain in left knee: Secondary | ICD-10-CM | POA: Diagnosis not present

## 2023-07-22 DIAGNOSIS — M25562 Pain in left knee: Secondary | ICD-10-CM | POA: Diagnosis not present

## 2023-07-24 DIAGNOSIS — M25562 Pain in left knee: Secondary | ICD-10-CM | POA: Diagnosis not present

## 2023-07-29 DIAGNOSIS — M25562 Pain in left knee: Secondary | ICD-10-CM | POA: Diagnosis not present

## 2023-07-31 DIAGNOSIS — M25562 Pain in left knee: Secondary | ICD-10-CM | POA: Diagnosis not present

## 2023-08-05 DIAGNOSIS — M25562 Pain in left knee: Secondary | ICD-10-CM | POA: Diagnosis not present

## 2023-08-07 DIAGNOSIS — M25562 Pain in left knee: Secondary | ICD-10-CM | POA: Diagnosis not present

## 2023-08-12 DIAGNOSIS — M25562 Pain in left knee: Secondary | ICD-10-CM | POA: Diagnosis not present

## 2023-08-14 DIAGNOSIS — M25562 Pain in left knee: Secondary | ICD-10-CM | POA: Diagnosis not present

## 2023-08-19 DIAGNOSIS — M25562 Pain in left knee: Secondary | ICD-10-CM | POA: Diagnosis not present

## 2023-08-21 DIAGNOSIS — M25562 Pain in left knee: Secondary | ICD-10-CM | POA: Diagnosis not present

## 2023-08-23 DIAGNOSIS — Z4789 Encounter for other orthopedic aftercare: Secondary | ICD-10-CM | POA: Diagnosis not present

## 2023-08-28 DIAGNOSIS — M25562 Pain in left knee: Secondary | ICD-10-CM | POA: Diagnosis not present

## 2023-08-30 DIAGNOSIS — M25562 Pain in left knee: Secondary | ICD-10-CM | POA: Diagnosis not present

## 2023-09-03 DIAGNOSIS — M25562 Pain in left knee: Secondary | ICD-10-CM | POA: Diagnosis not present

## 2023-09-05 DIAGNOSIS — M25562 Pain in left knee: Secondary | ICD-10-CM | POA: Diagnosis not present

## 2023-09-09 DIAGNOSIS — M25562 Pain in left knee: Secondary | ICD-10-CM | POA: Diagnosis not present

## 2023-09-11 DIAGNOSIS — M25562 Pain in left knee: Secondary | ICD-10-CM | POA: Diagnosis not present

## 2023-09-17 DIAGNOSIS — Z23 Encounter for immunization: Secondary | ICD-10-CM | POA: Diagnosis not present

## 2023-11-25 DIAGNOSIS — E039 Hypothyroidism, unspecified: Secondary | ICD-10-CM | POA: Diagnosis not present

## 2023-11-25 DIAGNOSIS — Z0000 Encounter for general adult medical examination without abnormal findings: Secondary | ICD-10-CM | POA: Diagnosis not present

## 2023-11-25 DIAGNOSIS — E559 Vitamin D deficiency, unspecified: Secondary | ICD-10-CM | POA: Diagnosis not present

## 2023-12-02 ENCOUNTER — Other Ambulatory Visit (HOSPITAL_COMMUNITY): Payer: Self-pay | Admitting: Registered Nurse

## 2023-12-02 DIAGNOSIS — Z8249 Family history of ischemic heart disease and other diseases of the circulatory system: Secondary | ICD-10-CM

## 2023-12-02 DIAGNOSIS — E039 Hypothyroidism, unspecified: Secondary | ICD-10-CM | POA: Diagnosis not present

## 2023-12-02 DIAGNOSIS — Z01419 Encounter for gynecological examination (general) (routine) without abnormal findings: Secondary | ICD-10-CM | POA: Diagnosis not present

## 2023-12-02 DIAGNOSIS — Z0000 Encounter for general adult medical examination without abnormal findings: Secondary | ICD-10-CM | POA: Diagnosis not present

## 2024-01-01 ENCOUNTER — Encounter (HOSPITAL_COMMUNITY): Payer: Self-pay

## 2024-01-01 ENCOUNTER — Ambulatory Visit (HOSPITAL_COMMUNITY): Payer: Medicare Other

## 2024-02-04 DIAGNOSIS — H35371 Puckering of macula, right eye: Secondary | ICD-10-CM | POA: Diagnosis not present

## 2024-02-11 DIAGNOSIS — H43813 Vitreous degeneration, bilateral: Secondary | ICD-10-CM | POA: Diagnosis not present

## 2024-02-11 DIAGNOSIS — H3581 Retinal edema: Secondary | ICD-10-CM | POA: Diagnosis not present

## 2024-02-11 DIAGNOSIS — H35371 Puckering of macula, right eye: Secondary | ICD-10-CM | POA: Diagnosis not present

## 2024-02-11 DIAGNOSIS — H2513 Age-related nuclear cataract, bilateral: Secondary | ICD-10-CM | POA: Diagnosis not present

## 2024-02-11 DIAGNOSIS — H43391 Other vitreous opacities, right eye: Secondary | ICD-10-CM | POA: Diagnosis not present

## 2024-02-11 DIAGNOSIS — H35363 Drusen (degenerative) of macula, bilateral: Secondary | ICD-10-CM | POA: Diagnosis not present

## 2024-03-24 DIAGNOSIS — H18413 Arcus senilis, bilateral: Secondary | ICD-10-CM | POA: Diagnosis not present

## 2024-03-24 DIAGNOSIS — H25043 Posterior subcapsular polar age-related cataract, bilateral: Secondary | ICD-10-CM | POA: Diagnosis not present

## 2024-03-24 DIAGNOSIS — H35371 Puckering of macula, right eye: Secondary | ICD-10-CM | POA: Diagnosis not present

## 2024-03-24 DIAGNOSIS — H2513 Age-related nuclear cataract, bilateral: Secondary | ICD-10-CM | POA: Diagnosis not present

## 2024-03-24 DIAGNOSIS — H2511 Age-related nuclear cataract, right eye: Secondary | ICD-10-CM | POA: Diagnosis not present

## 2024-03-30 DIAGNOSIS — H2511 Age-related nuclear cataract, right eye: Secondary | ICD-10-CM | POA: Diagnosis not present

## 2024-03-30 DIAGNOSIS — H3581 Retinal edema: Secondary | ICD-10-CM | POA: Diagnosis not present

## 2024-03-30 DIAGNOSIS — H35371 Puckering of macula, right eye: Secondary | ICD-10-CM | POA: Diagnosis not present

## 2024-04-07 DIAGNOSIS — Z9889 Other specified postprocedural states: Secondary | ICD-10-CM | POA: Diagnosis not present

## 2024-04-07 DIAGNOSIS — H35371 Puckering of macula, right eye: Secondary | ICD-10-CM | POA: Diagnosis not present

## 2024-04-29 DIAGNOSIS — H35371 Puckering of macula, right eye: Secondary | ICD-10-CM | POA: Diagnosis not present

## 2024-04-29 DIAGNOSIS — Z9889 Other specified postprocedural states: Secondary | ICD-10-CM | POA: Diagnosis not present

## 2024-10-05 DIAGNOSIS — E039 Hypothyroidism, unspecified: Secondary | ICD-10-CM | POA: Diagnosis not present

## 2024-10-05 DIAGNOSIS — G43119 Migraine with aura, intractable, without status migrainosus: Secondary | ICD-10-CM | POA: Diagnosis not present

## 2024-10-05 DIAGNOSIS — E559 Vitamin D deficiency, unspecified: Secondary | ICD-10-CM | POA: Diagnosis not present

## 2024-10-05 DIAGNOSIS — R030 Elevated blood-pressure reading, without diagnosis of hypertension: Secondary | ICD-10-CM | POA: Diagnosis not present

## 2024-11-25 DIAGNOSIS — E559 Vitamin D deficiency, unspecified: Secondary | ICD-10-CM | POA: Diagnosis not present

## 2024-11-25 DIAGNOSIS — R030 Elevated blood-pressure reading, without diagnosis of hypertension: Secondary | ICD-10-CM | POA: Diagnosis not present

## 2024-11-25 DIAGNOSIS — E039 Hypothyroidism, unspecified: Secondary | ICD-10-CM | POA: Diagnosis not present

## 2024-12-01 ENCOUNTER — Encounter: Payer: Self-pay | Admitting: *Deleted

## 2024-12-02 DIAGNOSIS — E039 Hypothyroidism, unspecified: Secondary | ICD-10-CM | POA: Diagnosis not present

## 2024-12-02 DIAGNOSIS — Z0000 Encounter for general adult medical examination without abnormal findings: Secondary | ICD-10-CM | POA: Diagnosis not present

## 2024-12-02 DIAGNOSIS — M8589 Other specified disorders of bone density and structure, multiple sites: Secondary | ICD-10-CM | POA: Diagnosis not present

## 2024-12-02 DIAGNOSIS — Z23 Encounter for immunization: Secondary | ICD-10-CM | POA: Diagnosis not present

## 2024-12-02 DIAGNOSIS — Z780 Asymptomatic menopausal state: Secondary | ICD-10-CM | POA: Diagnosis not present

## 2024-12-08 ENCOUNTER — Other Ambulatory Visit (HOSPITAL_BASED_OUTPATIENT_CLINIC_OR_DEPARTMENT_OTHER): Payer: Self-pay | Admitting: Registered Nurse

## 2024-12-08 DIAGNOSIS — Z0000 Encounter for general adult medical examination without abnormal findings: Secondary | ICD-10-CM
